# Patient Record
Sex: Male | Born: 1955 | Race: Black or African American | Hispanic: No
Health system: Southern US, Community
[De-identification: ages and names within clinical notes are randomized; demographics above are authoritative.]

---

## 2011-10-21 ENCOUNTER — Inpatient Hospital Stay: Payer: Self-pay | Admitting: Internal Medicine

## 2011-10-21 LAB — COMPREHENSIVE METABOLIC PANEL
Alkaline Phosphatase: 1769 U/L — ABNORMAL HIGH (ref 50–136)
Anion Gap: 24 — ABNORMAL HIGH (ref 7–16)
BUN: 65 mg/dL — ABNORMAL HIGH (ref 7–18)
Bilirubin,Total: 28.4 mg/dL — ABNORMAL HIGH (ref 0.2–1.0)
Calcium, Total: 8 mg/dL — ABNORMAL LOW (ref 8.5–10.1)
Chloride: 113 mmol/L — ABNORMAL HIGH (ref 98–107)
Co2: 8 mmol/L — CL (ref 21–32)
EGFR (African American): 31 — ABNORMAL LOW
EGFR (Non-African Amer.): 26 — ABNORMAL LOW
SGPT (ALT): 151 U/L — ABNORMAL HIGH

## 2011-10-21 LAB — CBC
HGB: 7.3 g/dL — ABNORMAL LOW (ref 13.0–18.0)
MCH: 31.8 pg (ref 26.0–34.0)
MCHC: 31.6 g/dL — ABNORMAL LOW (ref 32.0–36.0)
MCV: 101 fL — ABNORMAL HIGH (ref 80–100)
RBC: 2.29 10*6/uL — ABNORMAL LOW (ref 4.40–5.90)

## 2011-10-21 LAB — URINALYSIS, COMPLETE
Glucose,UR: NEGATIVE mg/dL (ref 0–75)
Leukocyte Esterase: NEGATIVE
Protein: 30
RBC,UR: 2 /HPF (ref 0–5)
Squamous Epithelial: <1

## 2011-10-21 LAB — DRUG SCREEN, URINE
Amphetamines, Ur Screen: NEGATIVE (ref ?–1000)
Benzodiazepine, Ur Scrn: NEGATIVE (ref ?–200)
Cocaine Metabolite,Ur ~~LOC~~: NEGATIVE (ref ?–300)
MDMA (Ecstasy)Ur Screen: NEGATIVE (ref ?–500)
Opiate, Ur Screen: NEGATIVE (ref ?–300)
Tricyclic, Ur Screen: NEGATIVE (ref ?–1000)

## 2011-10-21 LAB — ETHANOL: Ethanol %: <0.003 % (ref 0.000–0.080)

## 2011-10-21 LAB — CK TOTAL AND CKMB (NOT AT ARMC): CK, Total: 116 U/L (ref 35–232)

## 2011-10-22 LAB — CBC WITH DIFFERENTIAL/PLATELET
Bands: 2 %
HCT: 19.5 % — ABNORMAL LOW (ref 40.0–52.0)
HGB: 6.5 g/dL — ABNORMAL LOW (ref 13.0–18.0)
Lymphocytes: 10 %
MCHC: 33.4 g/dL (ref 32.0–36.0)
MCV: 98 fL (ref 80–100)
NRBC/100 WBC: 45 /
RBC: 2 10*6/uL — ABNORMAL LOW (ref 4.40–5.90)
RDW: 21.7 % — ABNORMAL HIGH (ref 11.5–14.5)
Segmented Neutrophils: 86 %
Variant Lymphocyte - H1-Rlymph: 1 %
WBC: 16.2 10*3/uL — ABNORMAL HIGH (ref 3.8–10.6)

## 2011-10-22 LAB — COMPREHENSIVE METABOLIC PANEL
Albumin: 1.9 g/dL — ABNORMAL LOW (ref 3.4–5.0)
Alkaline Phosphatase: 1630 U/L — ABNORMAL HIGH (ref 50–136)
Bilirubin,Total: 26.2 mg/dL — ABNORMAL HIGH (ref 0.2–1.0)
Chloride: 110 mmol/L — ABNORMAL HIGH (ref 98–107)
Co2: 18 mmol/L — ABNORMAL LOW (ref 21–32)
Creatinine: 2.49 mg/dL — ABNORMAL HIGH (ref 0.60–1.30)
EGFR (African American): 32 — ABNORMAL LOW
EGFR (Non-African Amer.): 28 — ABNORMAL LOW
Glucose: 275 mg/dL — ABNORMAL HIGH (ref 65–99)
SGOT(AST): 133 U/L — ABNORMAL HIGH (ref 15–37)
SGPT (ALT): 147 U/L — ABNORMAL HIGH

## 2011-10-22 LAB — CK TOTAL AND CKMB (NOT AT ARMC)
CK, Total: 158 U/L (ref 35–232)
CK-MB: 2.5 ng/mL (ref 0.5–3.6)

## 2011-10-22 LAB — HEMOGLOBIN
HGB: 6.7 g/dL — ABNORMAL LOW (ref 13.0–18.0)
HGB: 8.7 g/dL — ABNORMAL LOW (ref 13.0–18.0)

## 2011-10-22 LAB — BILIRUBIN, DIRECT: Bilirubin, Direct: 20.7 mg/dL — ABNORMAL HIGH (ref 0.00–0.20)

## 2011-10-23 LAB — COMPREHENSIVE METABOLIC PANEL
Albumin: 1.9 g/dL — ABNORMAL LOW (ref 3.4–5.0)
Alkaline Phosphatase: 1395 U/L — ABNORMAL HIGH (ref 50–136)
BUN: 55 mg/dL — ABNORMAL HIGH (ref 7–18)
Bilirubin,Total: 22.4 mg/dL — ABNORMAL HIGH (ref 0.2–1.0)
Chloride: 115 mmol/L — ABNORMAL HIGH (ref 98–107)
EGFR (Non-African Amer.): 39 — ABNORMAL LOW
Glucose: 129 mg/dL — ABNORMAL HIGH (ref 65–99)
Osmolality: 306 (ref 275–301)
SGOT(AST): 91 U/L — ABNORMAL HIGH (ref 15–37)
SGPT (ALT): 119 U/L — ABNORMAL HIGH
Sodium: 145 mmol/L (ref 136–145)

## 2011-10-23 LAB — CBC WITH DIFFERENTIAL/PLATELET
Basophil: 1 %
HGB: 8.7 g/dL — ABNORMAL LOW (ref 13.0–18.0)
MCH: 31.2 pg (ref 26.0–34.0)
MCHC: 32.9 g/dL (ref 32.0–36.0)
MCV: 95 fL (ref 80–100)
Platelet: 271 10*3/uL (ref 150–440)
RBC: 2.8 10*6/uL — ABNORMAL LOW (ref 4.40–5.90)
Segmented Neutrophils: 74 %

## 2011-10-24 LAB — CBC WITH DIFFERENTIAL/PLATELET
HGB: 7.8 g/dL — ABNORMAL LOW (ref 13.0–18.0)
Lymphocytes: 5 %
MCH: 30.6 pg (ref 26.0–34.0)
MCV: 94 fL (ref 80–100)
NRBC/100 WBC: 51 /
Platelet: 231 10*3/uL (ref 150–440)
RBC: 2.56 10*6/uL — ABNORMAL LOW (ref 4.40–5.90)
WBC: 14.3 10*3/uL — ABNORMAL HIGH (ref 3.8–10.6)

## 2011-10-24 LAB — COMPREHENSIVE METABOLIC PANEL
Albumin: 1.7 g/dL — ABNORMAL LOW (ref 3.4–5.0)
Anion Gap: 14 (ref 7–16)
BUN: 35 mg/dL — ABNORMAL HIGH (ref 7–18)
Calcium, Total: 7.4 mg/dL — ABNORMAL LOW (ref 8.5–10.1)
Co2: 13 mmol/L — ABNORMAL LOW (ref 21–32)
Creatinine: 1.24 mg/dL (ref 0.60–1.30)
EGFR (African American): >60
EGFR (Non-African Amer.): >60
Glucose: 159 mg/dL — ABNORMAL HIGH (ref 65–99)
Potassium: 3.2 mmol/L — ABNORMAL LOW (ref 3.5–5.1)
SGOT(AST): 54 U/L — ABNORMAL HIGH (ref 15–37)
SGPT (ALT): 94 U/L — ABNORMAL HIGH
Total Protein: 4.5 g/dL — ABNORMAL LOW (ref 6.4–8.2)

## 2011-10-25 LAB — CBC WITH DIFFERENTIAL/PLATELET
Bands: 3 %
Eosinophil: 3 %
HCT: 22.2 % — ABNORMAL LOW (ref 40.0–52.0)
HGB: 7.4 g/dL — ABNORMAL LOW (ref 13.0–18.0)
Lymphocytes: 13 %
MCV: 97 fL (ref 80–100)
Monocytes: 2 %
NRBC/100 WBC: 50 /
RBC: 2.29 10*6/uL — ABNORMAL LOW (ref 4.40–5.90)
Segmented Neutrophils: 79 %
WBC: 6.7 10*3/uL (ref 3.8–10.6)

## 2011-10-25 LAB — COMPREHENSIVE METABOLIC PANEL
Alkaline Phosphatase: 1000 U/L — ABNORMAL HIGH (ref 50–136)
Bilirubin,Total: 8.3 mg/dL — ABNORMAL HIGH (ref 0.2–1.0)
Calcium, Total: 7.8 mg/dL — ABNORMAL LOW (ref 8.5–10.1)
Chloride: 112 mmol/L — ABNORMAL HIGH (ref 98–107)
Co2: 17 mmol/L — ABNORMAL LOW (ref 21–32)
Creatinine: 1.01 mg/dL (ref 0.60–1.30)
EGFR (African American): >60
EGFR (Non-African Amer.): >60
Glucose: 120 mg/dL — ABNORMAL HIGH (ref 65–99)
SGOT(AST): 27 U/L (ref 15–37)
SGPT (ALT): 77 U/L
Total Protein: 4.5 g/dL — ABNORMAL LOW (ref 6.4–8.2)

## 2011-10-26 LAB — COMPREHENSIVE METABOLIC PANEL
Albumin: 1.7 g/dL — ABNORMAL LOW (ref 3.4–5.0)
BUN: 22 mg/dL — ABNORMAL HIGH (ref 7–18)
Bilirubin,Total: 7.8 mg/dL — ABNORMAL HIGH (ref 0.2–1.0)
Chloride: 112 mmol/L — ABNORMAL HIGH (ref 98–107)
Creatinine: 1.04 mg/dL (ref 0.60–1.30)
Glucose: 84 mg/dL (ref 65–99)
SGOT(AST): 34 U/L (ref 15–37)
SGPT (ALT): 74 U/L
Sodium: 140 mmol/L (ref 136–145)
Total Protein: 4.8 g/dL — ABNORMAL LOW (ref 6.4–8.2)

## 2011-10-27 LAB — CULTURE, BLOOD (SINGLE)

## 2011-10-28 ENCOUNTER — Ambulatory Visit: Payer: Self-pay | Admitting: Internal Medicine

## 2011-10-28 LAB — COMPREHENSIVE METABOLIC PANEL
Albumin: 1.8 g/dL — ABNORMAL LOW (ref 3.4–5.0)
BUN: 10 mg/dL (ref 7–18)
Creatinine: 0.82 mg/dL (ref 0.60–1.30)
Glucose: 109 mg/dL — ABNORMAL HIGH (ref 65–99)
Potassium: 4 mmol/L (ref 3.5–5.1)
SGOT(AST): 23 U/L (ref 15–37)
SGPT (ALT): 61 U/L
Total Protein: 5.1 g/dL — ABNORMAL LOW (ref 6.4–8.2)

## 2011-10-29 LAB — CBC WITH DIFFERENTIAL/PLATELET
Basophil #: 0 10*3/uL (ref 0.0–0.1)
Basophil %: 0.6 %
Eosinophil #: 0.1 10*3/uL (ref 0.0–0.7)
HGB: 6.2 g/dL — ABNORMAL LOW (ref 13.0–18.0)
Lymphocyte %: 24.4 %
MCHC: 34.9 g/dL (ref 32.0–36.0)
Monocyte #: 0.7 x10 3/mm (ref 0.2–1.0)
Neutrophil #: 4.4 10*3/uL (ref 1.4–6.5)
Neutrophil %: 63.7 %
Platelet: 288 10*3/uL (ref 150–440)
RBC: 1.76 10*6/uL — ABNORMAL LOW (ref 4.40–5.90)

## 2011-10-29 LAB — IRON AND TIBC
Iron Saturation: 23 %
Iron: 33 ug/dL — ABNORMAL LOW (ref 65–175)

## 2011-10-29 LAB — RETICULOCYTES
Absolute Retic Count: 0.2884 10*6/uL — ABNORMAL HIGH (ref 0.031–0.129)
Reticulocyte: 16.38 % — ABNORMAL HIGH (ref 0.7–2.5)

## 2011-10-29 LAB — PHOSPHORUS: Phosphorus: 2.3 mg/dL — ABNORMAL LOW

## 2011-10-29 LAB — FERRITIN: Ferritin (ARMC): 2464 ng/mL — ABNORMAL HIGH (ref 8–388)

## 2011-10-29 LAB — CREATININE, SERUM
Creatinine: 0.71 mg/dL
EGFR (African American): >60
EGFR (Non-African Amer.): >60

## 2011-10-29 LAB — MAGNESIUM: Magnesium: 1.2 mg/dL — ABNORMAL LOW

## 2011-10-30 LAB — CBC WITH DIFFERENTIAL/PLATELET
Basophil #: 0.1 10*3/uL (ref 0.0–0.1)
Basophil %: 0.7 %
Eosinophil %: 0.7 %
HCT: 23.6 % — ABNORMAL LOW (ref 40.0–52.0)
HGB: 8.2 g/dL — ABNORMAL LOW (ref 13.0–18.0)
Lymphocyte #: 1.7 10*3/uL (ref 1.0–3.6)
Lymphocyte %: 23.4 %
MCH: 34.6 pg — ABNORMAL HIGH (ref 26.0–34.0)
Monocyte %: 9.2 %
Neutrophil #: 4.7 10*3/uL (ref 1.4–6.5)
Platelet: 304 10*3/uL (ref 150–440)
RBC: 2.36 10*6/uL — ABNORMAL LOW (ref 4.40–5.90)
RDW: 19.9 % — ABNORMAL HIGH (ref 11.5–14.5)
WBC: 7.1 10*3/uL (ref 3.8–10.6)

## 2011-10-30 LAB — COMPREHENSIVE METABOLIC PANEL
Albumin: 1.7 g/dL — ABNORMAL LOW (ref 3.4–5.0)
Alkaline Phosphatase: 584 U/L — ABNORMAL HIGH (ref 50–136)
Anion Gap: 10 (ref 7–16)
BUN: 11 mg/dL (ref 7–18)
Bilirubin,Total: 5.5 mg/dL — ABNORMAL HIGH (ref 0.2–1.0)
Chloride: 108 mmol/L — ABNORMAL HIGH (ref 98–107)
EGFR (African American): >60
EGFR (Non-African Amer.): >60
Potassium: 4.1 mmol/L (ref 3.5–5.1)
SGPT (ALT): 49 U/L
Sodium: 140 mmol/L (ref 136–145)
Total Protein: 4.9 g/dL — ABNORMAL LOW (ref 6.4–8.2)

## 2011-10-31 LAB — COMPREHENSIVE METABOLIC PANEL
Anion Gap: 9 (ref 7–16)
Bilirubin,Total: 5.1 mg/dL — ABNORMAL HIGH (ref 0.2–1.0)
Calcium, Total: 7.4 mg/dL — ABNORMAL LOW (ref 8.5–10.1)
Chloride: 107 mmol/L (ref 98–107)
Co2: 23 mmol/L (ref 21–32)
Creatinine: 0.87 mg/dL (ref 0.60–1.30)
EGFR (African American): >60
EGFR (Non-African Amer.): >60
Osmolality: 275 (ref 275–301)
SGPT (ALT): 46 U/L
Sodium: 139 mmol/L (ref 136–145)

## 2011-11-01 LAB — COMPREHENSIVE METABOLIC PANEL
Alkaline Phosphatase: 561 U/L — ABNORMAL HIGH (ref 50–136)
BUN: 8 mg/dL (ref 7–18)
Bilirubin,Total: 5.5 mg/dL — ABNORMAL HIGH (ref 0.2–1.0)
Chloride: 108 mmol/L — ABNORMAL HIGH (ref 98–107)
Creatinine: 0.84 mg/dL (ref 0.60–1.30)
EGFR (African American): >60
EGFR (Non-African Amer.): >60
Glucose: 89 mg/dL (ref 65–99)
SGOT(AST): 23 U/L (ref 15–37)
SGPT (ALT): 45 U/L
Total Protein: 5 g/dL — ABNORMAL LOW (ref 6.4–8.2)

## 2011-11-01 LAB — URINE IEP, RANDOM

## 2011-11-03 ENCOUNTER — Ambulatory Visit: Payer: Self-pay | Admitting: Internal Medicine

## 2011-11-03 ENCOUNTER — Telehealth: Payer: Self-pay

## 2011-11-03 ENCOUNTER — Other Ambulatory Visit: Payer: Self-pay

## 2011-11-03 DIAGNOSIS — K8689 Other specified diseases of pancreas: Secondary | ICD-10-CM

## 2011-11-03 LAB — HEPATIC FUNCTION PANEL A (ARMC)
Albumin: 2.2 g/dL — ABNORMAL LOW (ref 3.4–5.0)
Alkaline Phosphatase: 595 U/L — ABNORMAL HIGH (ref 50–136)
Bilirubin, Direct: 5.4 mg/dL — ABNORMAL HIGH (ref 0.00–0.20)
Bilirubin,Total: 6.1 mg/dL — ABNORMAL HIGH (ref 0.2–1.0)
SGOT(AST): 28 U/L (ref 15–37)
SGPT (ALT): 52 U/L
Total Protein: 6.1 g/dL — ABNORMAL LOW (ref 6.4–8.2)

## 2011-11-03 LAB — CBC CANCER CENTER
Basophil %: 0.6 %
Eosinophil #: 0 x10 3/mm (ref 0.0–0.7)
Eosinophil %: 0.2 %
Lymphocyte #: 2.2 x10 3/mm (ref 1.0–3.6)
Lymphocyte %: 25.4 %
MCH: 34.4 pg — ABNORMAL HIGH (ref 26.0–34.0)
MCHC: 33.9 g/dL (ref 32.0–36.0)
MCV: 102 fL — ABNORMAL HIGH (ref 80–100)
Monocyte #: 0.6 x10 3/mm (ref 0.2–1.0)
Monocyte %: 7.2 %
Neutrophil %: 66.6 %
Platelet: 465 x10 3/mm — ABNORMAL HIGH (ref 150–440)
RBC: 2.88 10*6/uL — ABNORMAL LOW (ref 4.40–5.90)
RDW: 18.2 % — ABNORMAL HIGH (ref 11.5–14.5)
WBC: 8.6 x10 3/mm (ref 3.8–10.6)

## 2011-11-03 NOTE — Telephone Encounter (Signed)
Pt has been scheduled for EUS on 11/18/11 11 am NPO after midnight pt needs to be instructed and meds reviewed.

## 2011-11-03 NOTE — Telephone Encounter (Signed)
Unable to reach pt phone is asking for remote access code.  I have contacted Dr Lars Pinks office for another number or pt has an appt today with Dr Lorre Nick and I can fax instructions to them to give to the pt   Left message on machine to call back

## 2011-11-03 NOTE — Telephone Encounter (Signed)
Dr Paula Compton office will try and contact the pt and get back in touch with me

## 2011-11-04 ENCOUNTER — Ambulatory Visit: Payer: Self-pay | Admitting: Internal Medicine

## 2011-11-04 NOTE — Telephone Encounter (Signed)
Unable to reach pt no voice mail.

## 2011-11-04 NOTE — Telephone Encounter (Signed)
I spoke with Doni at Dr Paula Compton office and she is going to call the pt again and try to reach him.  I did mail the info to the home for the procedure and Dr Lorre Nick is aware

## 2011-11-10 LAB — CBC CANCER CENTER
Eosinophil %: 1.1 %
Lymphocyte #: 2 x10 3/mm (ref 1.0–3.6)
MCH: 33.7 pg (ref 26.0–34.0)
MCHC: 33.6 g/dL (ref 32.0–36.0)
MCV: 100 fL (ref 80–100)
Monocyte #: 0.4 x10 3/mm (ref 0.2–1.0)
Monocyte %: 6 %
Neutrophil %: 63.2 %
Platelet: 421 x10 3/mm (ref 150–440)
RBC: 3.12 10*6/uL — ABNORMAL LOW (ref 4.40–5.90)

## 2011-11-10 LAB — OCCULT BLOOD X 1 CARD TO LAB, STOOL
Occult Blood, Feces: NEGATIVE
Occult Blood, Feces: NEGATIVE

## 2011-11-11 LAB — CREATININE, SERUM
Creatinine: 1.05 mg/dL (ref 0.60–1.30)
EGFR (African American): >60
EGFR (Non-African Amer.): >60

## 2011-11-11 LAB — HEPATIC FUNCTION PANEL A (ARMC)
Albumin: 2.4 g/dL — ABNORMAL LOW (ref 3.4–5.0)
Bilirubin, Direct: 5 mg/dL — ABNORMAL HIGH (ref 0.00–0.20)
Bilirubin,Total: 5.3 mg/dL — ABNORMAL HIGH (ref 0.2–1.0)
SGPT (ALT): 33 U/L (ref 12–78)
Total Protein: 6.6 g/dL (ref 6.4–8.2)

## 2011-11-11 LAB — PROTIME-INR
INR: 1.2
Prothrombin Time: 15.2 secs — ABNORMAL HIGH (ref 11.5–14.7)

## 2011-11-12 LAB — CEA: CEA: 8.9 ng/mL — ABNORMAL HIGH (ref 0.0–4.7)

## 2011-11-16 ENCOUNTER — Telehealth: Payer: Self-pay | Admitting: Gastroenterology

## 2011-11-16 ENCOUNTER — Inpatient Hospital Stay: Payer: Self-pay | Admitting: Internal Medicine

## 2011-11-16 LAB — COMPREHENSIVE METABOLIC PANEL
Alkaline Phosphatase: 296 U/L — ABNORMAL HIGH (ref 50–136)
Bilirubin,Total: 4.7 mg/dL — ABNORMAL HIGH (ref 0.2–1.0)
Chloride: 100 mmol/L (ref 98–107)
Co2: 33 mmol/L — ABNORMAL HIGH (ref 21–32)
Creatinine: 1.25 mg/dL (ref 0.60–1.30)
EGFR (African American): >60
EGFR (Non-African Amer.): >60
SGOT(AST): 23 U/L (ref 15–37)
SGPT (ALT): 23 U/L (ref 12–78)

## 2011-11-16 LAB — LIPASE, BLOOD: Lipase: 23 U/L — ABNORMAL LOW (ref 73–393)

## 2011-11-16 LAB — CBC
HGB: 11 g/dL — ABNORMAL LOW (ref 13.0–18.0)
MCH: 31.5 pg (ref 26.0–34.0)
MCV: 98 fL (ref 80–100)
RBC: 3.51 10*6/uL — ABNORMAL LOW (ref 4.40–5.90)
WBC: 14.3 10*3/uL — ABNORMAL HIGH (ref 3.8–10.6)

## 2011-11-16 NOTE — Telephone Encounter (Signed)
I called and the appt has not been cx at [facility] endo call placed to [facility] to ask if he has heard anything. Left message

## 2011-11-16 NOTE — Telephone Encounter (Signed)
Pt has not notified this office that the procedure has been moved.

## 2011-11-17 ENCOUNTER — Telehealth: Payer: Self-pay

## 2011-11-17 LAB — CBC WITH DIFFERENTIAL/PLATELET
Basophil #: 0 10*3/uL (ref 0.0–0.1)
HCT: 33.5 % — ABNORMAL LOW (ref 40.0–52.0)
HGB: 11 g/dL — ABNORMAL LOW (ref 13.0–18.0)
Lymphocyte %: 11 %
Monocyte %: 6.4 %
Neutrophil #: 11.8 10*3/uL — ABNORMAL HIGH (ref 1.4–6.5)
Neutrophil %: 82.4 %
RDW: 15.5 % — ABNORMAL HIGH (ref 11.5–14.5)
WBC: 14.3 10*3/uL — ABNORMAL HIGH (ref 3.8–10.6)

## 2011-11-17 LAB — BASIC METABOLIC PANEL
Anion Gap: 10 (ref 7–16)
Calcium, Total: 8.1 mg/dL — ABNORMAL LOW (ref 8.5–10.1)
Co2: 31 mmol/L (ref 21–32)
Creatinine: 1.08 mg/dL (ref 0.60–1.30)
EGFR (African American): >60
EGFR (Non-African Amer.): >60
Glucose: 130 mg/dL — ABNORMAL HIGH (ref 65–99)
Potassium: 2.9 mmol/L — ABNORMAL LOW (ref 3.5–5.1)
Sodium: 145 mmol/L (ref 136–145)

## 2011-11-17 LAB — URINALYSIS, COMPLETE
Bacteria: NONE SEEN
Bilirubin,UR: NEGATIVE
Glucose,UR: NEGATIVE mg/dL (ref 0–75)
Ph: 5 (ref 4.5–8.0)
RBC,UR: 10 /HPF (ref 0–5)
Squamous Epithelial: 2

## 2011-11-17 NOTE — Telephone Encounter (Signed)
Pt cx at [facility] per Noreene Larsson

## 2011-11-17 NOTE — Telephone Encounter (Signed)
I spoke with Corey Ortiz from [facility]. The patient has a gastric outlet obstruction, very low potassium level. He left from [facility] today AGAINST MEDICAL ADVICE, he pulled his NG tube out and walked out of the hospital. He did not get enough potassium from what they can tell.  I called Corey Ortiz on the phone, he was at home. I explained to him that given his emergency situation with outlet obstruction, dangerously low electrolyte levels I recommended very strongly that he go back to the emergency room, back to [facility] cancer Center. Had her explained to me that they are very happy to help take care of him as I am as well.  For now, we are going to cancel his endoscopic ultrasound until I hear back from [name].  I do not think however that elective endoscopic ultrasound to stage his pancreatic or biliary cancer is a good idea in the setting of acute gastric obstruction, acute electrolyte abnormality.

## 2011-11-17 NOTE — Telephone Encounter (Signed)
Heather called from Dr Lars Pinks office and wanted to notify Dr Christella Hartigan that the pt was admitted to [facility] yesterday with gastric obstruction.  He left against medical advice with a 2.9 potassium.  Dr Ardis Rowan is covering for Dr Neale Burly.  [phone] call back number

## 2011-11-18 ENCOUNTER — Inpatient Hospital Stay: Payer: Self-pay | Admitting: Internal Medicine

## 2011-11-18 ENCOUNTER — Encounter (HOSPITAL_COMMUNITY): Admission: RE | Payer: Self-pay | Source: Ambulatory Visit

## 2011-11-18 ENCOUNTER — Ambulatory Visit (HOSPITAL_COMMUNITY)
Admission: RE | Admit: 2011-11-18 | Payer: TRICARE For Life (TFL) | Source: Ambulatory Visit | Admitting: Gastroenterology

## 2011-11-18 LAB — COMPREHENSIVE METABOLIC PANEL
Alkaline Phosphatase: 282 U/L — ABNORMAL HIGH (ref 50–136)
Anion Gap: 11 (ref 7–16)
BUN: 31 mg/dL — ABNORMAL HIGH (ref 7–18)
Bilirubin,Total: 4.4 mg/dL — ABNORMAL HIGH (ref 0.2–1.0)
Calcium, Total: 8.9 mg/dL (ref 8.5–10.1)
Chloride: 101 mmol/L (ref 98–107)
Creatinine: 1.01 mg/dL (ref 0.60–1.30)
EGFR (African American): >60
EGFR (Non-African Amer.): >60
Glucose: 122 mg/dL — ABNORMAL HIGH (ref 65–99)
Osmolality: 298 (ref 275–301)
Potassium: 2.7 mmol/L — ABNORMAL LOW (ref 3.5–5.1)
Sodium: 146 mmol/L — ABNORMAL HIGH (ref 136–145)
Total Protein: 6.6 g/dL (ref 6.4–8.2)

## 2011-11-18 LAB — URINALYSIS, COMPLETE
Bacteria: NONE SEEN
Bilirubin,UR: NEGATIVE
Blood: NEGATIVE
Nitrite: NEGATIVE
Ph: 5 (ref 4.5–8.0)
RBC,UR: 1 /HPF (ref 0–5)
Specific Gravity: 1.034 (ref 1.003–1.030)
Squamous Epithelial: <1
WBC UR: 7 /HPF (ref 0–5)

## 2011-11-18 LAB — CBC
HGB: 14.4 g/dL (ref 13.0–18.0)
MCH: 31.1 pg (ref 26.0–34.0)
MCHC: 32.1 g/dL (ref 32.0–36.0)
MCV: 97 fL (ref 80–100)
RBC: 4.62 10*6/uL (ref 4.40–5.90)
RDW: 15.4 % — ABNORMAL HIGH (ref 11.5–14.5)

## 2011-11-18 SURGERY — UPPER ENDOSCOPIC ULTRASOUND (EUS) LINEAR
Anesthesia: Monitor Anesthesia Care

## 2011-11-24 ENCOUNTER — Inpatient Hospital Stay: Payer: Self-pay | Admitting: Surgery

## 2011-11-24 LAB — CBC
MCHC: 32.7 g/dL (ref 32.0–36.0)
MCV: 97 fL (ref 80–100)
RDW: 15.6 % — ABNORMAL HIGH (ref 11.5–14.5)

## 2011-11-24 LAB — PROTIME-INR: Prothrombin Time: 14.9 secs — ABNORMAL HIGH (ref 11.5–14.7)

## 2011-11-24 LAB — TSH: Thyroid Stimulating Horm: 0.68 u[IU]/mL

## 2011-11-24 LAB — LIPASE, BLOOD: Lipase: 28 U/L — ABNORMAL LOW (ref 73–393)

## 2011-11-24 LAB — COMPREHENSIVE METABOLIC PANEL
Albumin: 2.2 g/dL — ABNORMAL LOW (ref 3.4–5.0)
Anion Gap: 7 (ref 7–16)
BUN: 30 mg/dL — ABNORMAL HIGH (ref 7–18)
Bilirubin,Total: 3.5 mg/dL — ABNORMAL HIGH (ref 0.2–1.0)
Co2: 35 mmol/L — ABNORMAL HIGH (ref 21–32)
Creatinine: 1.29 mg/dL (ref 0.60–1.30)
SGOT(AST): 37 U/L (ref 15–37)
SGPT (ALT): 33 U/L (ref 12–78)
Sodium: 145 mmol/L (ref 136–145)
Total Protein: 5.8 g/dL — ABNORMAL LOW (ref 6.4–8.2)

## 2011-11-24 LAB — ACETAMINOPHEN LEVEL: Acetaminophen: <2 ug/mL

## 2011-11-24 LAB — ETHANOL
Ethanol %: <0.003 % (ref 0.000–0.080)
Ethanol: <3 mg/dL

## 2011-11-24 LAB — AMMONIA: Ammonia, Plasma: <25 mcmol/L (ref 11–32)

## 2011-11-24 LAB — TROPONIN I: Troponin-I: 0.37 ng/mL — ABNORMAL HIGH

## 2011-11-25 LAB — URINALYSIS, COMPLETE
Blood: NEGATIVE
Glucose,UR: 50 mg/dL (ref 0–75)
Hyaline Cast: 6
Nitrite: NEGATIVE
Ph: 5 (ref 4.5–8.0)
Protein: 30
Specific Gravity: 1.026 (ref 1.003–1.030)
WBC UR: 45 /HPF (ref 0–5)

## 2011-11-25 LAB — BASIC METABOLIC PANEL
BUN: 32 mg/dL — ABNORMAL HIGH (ref 7–18)
Calcium, Total: 8.1 mg/dL — ABNORMAL LOW (ref 8.5–10.1)
EGFR (African American): >60
EGFR (Non-African Amer.): >60
Glucose: 176 mg/dL — ABNORMAL HIGH (ref 65–99)
Osmolality: 309 (ref 275–301)

## 2011-11-25 LAB — DRUG SCREEN, URINE
Barbiturates, Ur Screen: NEGATIVE (ref ?–200)
Benzodiazepine, Ur Scrn: NEGATIVE (ref ?–200)
Cannabinoid 50 Ng, Ur ~~LOC~~: NEGATIVE (ref ?–50)
Cocaine Metabolite,Ur ~~LOC~~: NEGATIVE (ref ?–300)
Methadone, Ur Screen: NEGATIVE (ref ?–300)
Tricyclic, Ur Screen: NEGATIVE (ref ?–1000)

## 2011-11-25 LAB — CBC WITH DIFFERENTIAL/PLATELET
Basophil %: 0.2 %
Eosinophil %: 0 %
HCT: 33 % — ABNORMAL LOW (ref 40.0–52.0)
HGB: 10.9 g/dL — ABNORMAL LOW (ref 13.0–18.0)
Lymphocyte %: 9.9 %
Monocyte #: 1 x10 3/mm (ref 0.2–1.0)
Monocyte %: 9 %
Neutrophil %: 80.9 %
Platelet: 232 10*3/uL (ref 150–440)
RBC: 3.41 10*6/uL — ABNORMAL LOW (ref 4.40–5.90)
WBC: 10.9 10*3/uL — ABNORMAL HIGH (ref 3.8–10.6)

## 2011-11-26 LAB — PROTIME-INR: Prothrombin Time: 14.8 secs — ABNORMAL HIGH (ref 11.5–14.7)

## 2011-11-26 LAB — BASIC METABOLIC PANEL
Anion Gap: 4 — ABNORMAL LOW (ref 7–16)
BUN: 23 mg/dL — ABNORMAL HIGH (ref 7–18)
Calcium, Total: 7.9 mg/dL — ABNORMAL LOW (ref 8.5–10.1)
Chloride: 111 mmol/L — ABNORMAL HIGH (ref 98–107)
Co2: 32 mmol/L (ref 21–32)
Creatinine: 0.94 mg/dL (ref 0.60–1.30)
Glucose: 163 mg/dL — ABNORMAL HIGH (ref 65–99)
Potassium: 3.1 mmol/L — ABNORMAL LOW (ref 3.5–5.1)
Sodium: 147 mmol/L — ABNORMAL HIGH (ref 136–145)

## 2011-11-26 LAB — CBC WITH DIFFERENTIAL/PLATELET
Basophil #: 0.1 10*3/uL (ref 0.0–0.1)
Lymphocyte #: 1 10*3/uL (ref 1.0–3.6)
Lymphocyte %: 8.6 %
MCHC: 32.7 g/dL (ref 32.0–36.0)
Monocyte %: 5 %
Neutrophil %: 85.8 %
Platelet: 176 10*3/uL (ref 150–440)
RDW: 15.9 % — ABNORMAL HIGH (ref 11.5–14.5)

## 2011-11-26 LAB — APTT: Activated PTT: 33.8 secs (ref 23.6–35.9)

## 2011-11-27 LAB — CBC WITH DIFFERENTIAL/PLATELET
Basophil %: 0.4 %
Eosinophil #: 0.1 10*3/uL (ref 0.0–0.7)
Eosinophil %: 0.9 %
HCT: 26.6 % — ABNORMAL LOW (ref 40.0–52.0)
HGB: 8.7 g/dL — ABNORMAL LOW (ref 13.0–18.0)
Lymphocyte #: 1.6 10*3/uL (ref 1.0–3.6)
MCH: 31.9 pg (ref 26.0–34.0)
MCV: 97 fL (ref 80–100)
Monocyte #: 0.4 x10 3/mm (ref 0.2–1.0)
Monocyte %: 5.2 %
Neutrophil #: 6.5 10*3/uL (ref 1.4–6.5)
Neutrophil %: 75.4 %
RBC: 2.73 10*6/uL — ABNORMAL LOW (ref 4.40–5.90)
WBC: 8.6 10*3/uL (ref 3.8–10.6)

## 2011-11-27 LAB — BASIC METABOLIC PANEL
Anion Gap: 8 (ref 7–16)
Chloride: 114 mmol/L — ABNORMAL HIGH (ref 98–107)
Co2: 25 mmol/L (ref 21–32)
Creatinine: 0.68 mg/dL (ref 0.60–1.30)
EGFR (African American): >60
Sodium: 147 mmol/L — ABNORMAL HIGH (ref 136–145)

## 2011-11-27 LAB — MAGNESIUM: Magnesium: 1.5 mg/dL — ABNORMAL LOW

## 2011-11-28 LAB — HEPATIC FUNCTION PANEL A (ARMC)
Alkaline Phosphatase: 116 U/L (ref 50–136)
Bilirubin,Total: 2.2 mg/dL — ABNORMAL HIGH (ref 0.2–1.0)
SGOT(AST): 23 U/L (ref 15–37)
SGPT (ALT): 23 U/L (ref 12–78)
Total Protein: 4.6 g/dL — ABNORMAL LOW (ref 6.4–8.2)

## 2011-11-30 LAB — CREATININE, SERUM
EGFR (African American): >60
EGFR (Non-African Amer.): >60

## 2011-11-30 LAB — CBC WITH DIFFERENTIAL/PLATELET
Basophil %: 0.1 %
Eosinophil #: 0 10*3/uL (ref 0.0–0.7)
Eosinophil %: 0 %
HCT: 25.5 % — ABNORMAL LOW (ref 40.0–52.0)
HGB: 8.3 g/dL — ABNORMAL LOW (ref 13.0–18.0)
Lymphocyte #: 1.7 10*3/uL (ref 1.0–3.6)
Lymphocyte %: 11.1 %
Lymphocytes: 8 %
MCH: 30.7 pg (ref 26.0–34.0)
MCHC: 32.4 g/dL (ref 32.0–36.0)
Monocytes: 2 %
Neutrophil #: 12.6 10*3/uL — ABNORMAL HIGH (ref 1.4–6.5)
Neutrophil %: 84.1 %
RDW: 16.5 % — ABNORMAL HIGH (ref 11.5–14.5)
WBC: 15 10*3/uL — ABNORMAL HIGH (ref 3.8–10.6)

## 2011-11-30 LAB — COMPREHENSIVE METABOLIC PANEL
Albumin: 1.4 g/dL — ABNORMAL LOW (ref 3.4–5.0)
Calcium, Total: 7.2 mg/dL — ABNORMAL LOW (ref 8.5–10.1)
Chloride: 103 mmol/L (ref 98–107)
Co2: 22 mmol/L (ref 21–32)
Creatinine: 1.03 mg/dL (ref 0.60–1.30)
EGFR (African American): >60
EGFR (Non-African Amer.): >60
Osmolality: 277 (ref 275–301)
Potassium: 3.4 mmol/L — ABNORMAL LOW (ref 3.5–5.1)
Sodium: 136 mmol/L (ref 136–145)
Total Protein: 4.1 g/dL — ABNORMAL LOW (ref 6.4–8.2)

## 2011-11-30 LAB — PROTIME-INR
INR: 1.5
Prothrombin Time: 18.5 secs — ABNORMAL HIGH (ref 11.5–14.7)

## 2011-11-30 LAB — HEMOGLOBIN: HGB: 6.2 g/dL — ABNORMAL LOW (ref 13.0–18.0)

## 2011-12-01 LAB — CBC WITH DIFFERENTIAL/PLATELET
Basophil #: 0 10*3/uL (ref 0.0–0.1)
Eosinophil #: 0 10*3/uL (ref 0.0–0.7)
HCT: 23.5 % — ABNORMAL LOW (ref 40.0–52.0)
Lymphocyte %: 16 %
MCH: 31.8 pg (ref 26.0–34.0)
MCHC: 33.7 g/dL (ref 32.0–36.0)
Monocyte #: 0.8 x10 3/mm (ref 0.2–1.0)
Monocyte %: 8.4 %
Neutrophil #: 7.4 10*3/uL — ABNORMAL HIGH (ref 1.4–6.5)
Neutrophil %: 75.2 %
Platelet: 171 10*3/uL (ref 150–440)
RDW: 15.8 % — ABNORMAL HIGH (ref 11.5–14.5)

## 2011-12-01 LAB — COMPREHENSIVE METABOLIC PANEL
Albumin: 1.3 g/dL — ABNORMAL LOW (ref 3.4–5.0)
Anion Gap: 9 (ref 7–16)
BUN: 11 mg/dL (ref 7–18)
Chloride: 112 mmol/L — ABNORMAL HIGH (ref 98–107)
EGFR (African American): >60
Osmolality: 298 (ref 275–301)
Potassium: 4.9 mmol/L (ref 3.5–5.1)
SGOT(AST): 19 U/L (ref 15–37)
Total Protein: 3.5 g/dL — ABNORMAL LOW (ref 6.4–8.2)

## 2011-12-01 LAB — HEMOGLOBIN: HGB: 9.5 g/dL — ABNORMAL LOW (ref 13.0–18.0)

## 2011-12-02 LAB — CBC WITH DIFFERENTIAL/PLATELET
Basophil #: 0 10*3/uL (ref 0.0–0.1)
Basophil %: 0.2 %
Eosinophil #: 0.1 10*3/uL (ref 0.0–0.7)
Eosinophil %: 0.7 %
HCT: 26.8 % — ABNORMAL LOW (ref 40.0–52.0)
HGB: 9 g/dL — ABNORMAL LOW (ref 13.0–18.0)
Lymphocyte %: 17.4 %
MCH: 29.7 pg (ref 26.0–34.0)
MCV: 89 fL (ref 80–100)
Neutrophil %: 74.6 %
RBC: 3.02 10*6/uL — ABNORMAL LOW (ref 4.40–5.90)
RDW: 16.1 % — ABNORMAL HIGH (ref 11.5–14.5)

## 2011-12-02 LAB — BASIC METABOLIC PANEL
BUN: 10 mg/dL (ref 7–18)
Chloride: 110 mmol/L — ABNORMAL HIGH (ref 98–107)
Co2: 24 mmol/L (ref 21–32)
Creatinine: 0.78 mg/dL (ref 0.60–1.30)
Glucose: 93 mg/dL (ref 65–99)
Potassium: 3.4 mmol/L — ABNORMAL LOW (ref 3.5–5.1)
Sodium: 142 mmol/L (ref 136–145)

## 2011-12-02 LAB — PATHOLOGY REPORT

## 2011-12-03 LAB — AMMONIA: Ammonia, Plasma: 25 mcmol/L (ref 11–32)

## 2011-12-04 LAB — CBC WITH DIFFERENTIAL/PLATELET
Basophil #: 0 10*3/uL (ref 0.0–0.1)
Basophil %: 0.2 %
Eosinophil #: 0 10*3/uL (ref 0.0–0.7)
Eosinophil #: 0 10*3/uL (ref 0.0–0.7)
Eosinophil %: 0 %
Eosinophil %: 0 %
HCT: 24.7 % — ABNORMAL LOW (ref 40.0–52.0)
HGB: 5.1 g/dL — ABNORMAL LOW (ref 13.0–18.0)
Lymphocyte #: 0.9 10*3/uL — ABNORMAL LOW (ref 1.0–3.6)
Lymphocyte #: 1.1 10*3/uL (ref 1.0–3.6)
MCH: 30.6 pg (ref 26.0–34.0)
MCH: 30 pg (ref 26.0–34.0)
MCHC: 34.4 g/dL (ref 32.0–36.0)
MCHC: 35.1 g/dL (ref 32.0–36.0)
MCV: 89 fL (ref 80–100)
Monocyte #: 0.4 x10 3/mm (ref 0.2–1.0)
Monocyte #: 0.7 x10 3/mm (ref 0.2–1.0)
Monocyte %: 6.8 %
Neutrophil %: 83.4 %
Neutrophil %: 82.1 %
Platelet: 179 10*3/uL (ref 150–440)
Platelet: 122 10*3/uL — ABNORMAL LOW (ref 150–440)
RBC: 1.68 10*6/uL — ABNORMAL LOW (ref 4.40–5.90)
RDW: 15.7 % — ABNORMAL HIGH (ref 11.5–14.5)
RDW: 16.2 % — ABNORMAL HIGH (ref 11.5–14.5)
WBC: 8.3 10*3/uL (ref 3.8–10.6)

## 2011-12-04 LAB — COMPREHENSIVE METABOLIC PANEL
BUN: 15 mg/dL (ref 7–18)
Bilirubin,Total: 1.5 mg/dL — ABNORMAL HIGH (ref 0.2–1.0)
Calcium, Total: 7.2 mg/dL — ABNORMAL LOW (ref 8.5–10.1)
Chloride: 112 mmol/L — ABNORMAL HIGH (ref 98–107)
Co2: 24 mmol/L (ref 21–32)
EGFR (African American): >60
EGFR (Non-African Amer.): >60
Glucose: 241 mg/dL — ABNORMAL HIGH (ref 65–99)
SGOT(AST): 18 U/L (ref 15–37)
SGPT (ALT): 27 U/L (ref 12–78)
Total Protein: 3.3 g/dL — ABNORMAL LOW (ref 6.4–8.2)

## 2011-12-04 LAB — HEPATIC FUNCTION PANEL A (ARMC): Bilirubin, Direct: 1.3 mg/dL — ABNORMAL HIGH (ref 0.00–0.20)

## 2011-12-04 LAB — FIBRINOGEN: Fibrinogen: 213 mg/dL (ref 210–470)

## 2011-12-04 LAB — PROTIME-INR: Prothrombin Time: 19.6 secs — ABNORMAL HIGH (ref 11.5–14.7)

## 2011-12-05 ENCOUNTER — Ambulatory Visit: Payer: Self-pay | Admitting: Internal Medicine

## 2011-12-05 LAB — CBC WITH DIFFERENTIAL/PLATELET
Eosinophil %: 0 %
HCT: 25.8 % — ABNORMAL LOW (ref 40.0–52.0)
Lymphocyte #: 1.1 10*3/uL (ref 1.0–3.6)
Lymphocyte %: 9.6 %
MCV: 85 fL (ref 80–100)
Monocyte %: 7.1 %
Platelet: 131 10*3/uL — ABNORMAL LOW (ref 150–440)
RBC: 3.04 10*6/uL — ABNORMAL LOW (ref 4.40–5.90)
WBC: 11.7 10*3/uL — ABNORMAL HIGH (ref 3.8–10.6)

## 2011-12-05 LAB — COMPREHENSIVE METABOLIC PANEL
Albumin: 1.8 g/dL — ABNORMAL LOW (ref 3.4–5.0)
Alkaline Phosphatase: 74 U/L (ref 50–136)
Calcium, Total: 7.5 mg/dL — ABNORMAL LOW (ref 8.5–10.1)
Co2: 24 mmol/L (ref 21–32)
EGFR (African American): >60
EGFR (Non-African Amer.): >60
Glucose: 223 mg/dL — ABNORMAL HIGH (ref 65–99)
Osmolality: 298 (ref 275–301)
Potassium: 3.5 mmol/L (ref 3.5–5.1)
SGOT(AST): 22 U/L (ref 15–37)
Sodium: 145 mmol/L (ref 136–145)

## 2011-12-05 LAB — APTT: Activated PTT: 31.1 secs (ref 23.6–35.9)

## 2011-12-05 LAB — PROTIME-INR: Prothrombin Time: 17.7 secs — ABNORMAL HIGH (ref 11.5–14.7)

## 2011-12-06 LAB — BASIC METABOLIC PANEL
Anion Gap: 8 (ref 7–16)
Calcium, Total: 7.4 mg/dL — ABNORMAL LOW (ref 8.5–10.1)
Co2: 22 mmol/L (ref 21–32)
Creatinine: 0.81 mg/dL (ref 0.60–1.30)
EGFR (African American): >60
EGFR (Non-African Amer.): >60
Osmolality: 298 (ref 275–301)

## 2011-12-06 LAB — CBC WITH DIFFERENTIAL/PLATELET
Basophil #: 0 10*3/uL (ref 0.0–0.1)
Basophil #: 0 10*3/uL (ref 0.0–0.1)
Basophil %: 0.1 %
Basophil %: 0.1 %
Eosinophil #: 0 10*3/uL (ref 0.0–0.7)
Eosinophil #: 0 10*3/uL (ref 0.0–0.7)
Eosinophil %: 0 %
HCT: 28.8 % — ABNORMAL LOW (ref 40.0–52.0)
HCT: 26.9 % — ABNORMAL LOW (ref 40.0–52.0)
HGB: 10 g/dL — ABNORMAL LOW (ref 13.0–18.0)
HGB: 9.3 g/dL — ABNORMAL LOW (ref 13.0–18.0)
Lymphocyte %: 6.6 %
Lymphocyte %: 6.7 %
MCH: 30.2 pg (ref 26.0–34.0)
MCH: 29.9 pg (ref 26.0–34.0)
MCHC: 34.8 g/dL (ref 32.0–36.0)
MCHC: 34.4 g/dL (ref 32.0–36.0)
Monocyte #: 0.9 x10 3/mm (ref 0.2–1.0)
Monocyte #: 0.8 x10 3/mm (ref 0.2–1.0)
Monocyte %: 5.3 %
Monocyte %: 4.4 %
Neutrophil #: 15 10*3/uL — ABNORMAL HIGH (ref 1.4–6.5)
Neutrophil #: 15.6 10*3/uL — ABNORMAL HIGH (ref 1.4–6.5)
Platelet: 148 10*3/uL — ABNORMAL LOW (ref 150–440)
RBC: 3.32 10*6/uL — ABNORMAL LOW (ref 4.40–5.90)
RBC: 3.1 10*6/uL — ABNORMAL LOW (ref 4.40–5.90)
RDW: 16.8 % — ABNORMAL HIGH (ref 11.5–14.5)
RDW: 16.5 % — ABNORMAL HIGH (ref 11.5–14.5)
WBC: 17.1 10*3/uL — ABNORMAL HIGH (ref 3.8–10.6)
WBC: 17.6 10*3/uL — ABNORMAL HIGH (ref 3.8–10.6)

## 2011-12-06 LAB — COMPREHENSIVE METABOLIC PANEL
Albumin: 1.6 g/dL — ABNORMAL LOW (ref 3.4–5.0)
Alkaline Phosphatase: 71 U/L (ref 50–136)
Anion Gap: 6 — ABNORMAL LOW (ref 7–16)
BUN: 23 mg/dL — ABNORMAL HIGH (ref 7–18)
Bilirubin,Total: 1.6 mg/dL — ABNORMAL HIGH (ref 0.2–1.0)
Chloride: 115 mmol/L — ABNORMAL HIGH (ref 98–107)
Creatinine: 0.77 mg/dL (ref 0.60–1.30)
Glucose: 200 mg/dL — ABNORMAL HIGH (ref 65–99)
Osmolality: 298 (ref 275–301)
SGOT(AST): 14 U/L — ABNORMAL LOW (ref 15–37)
SGPT (ALT): 34 U/L (ref 12–78)
Total Protein: 3.9 g/dL — ABNORMAL LOW (ref 6.4–8.2)

## 2011-12-06 LAB — PROTIME-INR
INR: 1.5
Prothrombin Time: 18.8 secs — ABNORMAL HIGH (ref 11.5–14.7)

## 2011-12-06 LAB — APTT: Activated PTT: 30.2 secs (ref 23.6–35.9)

## 2011-12-06 LAB — MAGNESIUM: Magnesium: 1.3 mg/dL — ABNORMAL LOW

## 2011-12-07 LAB — CBC WITH DIFFERENTIAL/PLATELET
Basophil #: 0 10*3/uL (ref 0.0–0.1)
Basophil %: 0.2 %
HCT: 27.1 % — ABNORMAL LOW (ref 40.0–52.0)
HGB: 9.2 g/dL — ABNORMAL LOW (ref 13.0–18.0)
Lymphocyte #: 1.1 10*3/uL (ref 1.0–3.6)
MCH: 29.7 pg (ref 26.0–34.0)
MCHC: 34.1 g/dL (ref 32.0–36.0)
MCV: 87 fL (ref 80–100)
Monocyte #: 0.7 x10 3/mm (ref 0.2–1.0)
Monocyte %: 4.1 %
Neutrophil #: 14.8 10*3/uL — ABNORMAL HIGH (ref 1.4–6.5)
Platelet: 154 10*3/uL (ref 150–440)
RDW: 16.5 % — ABNORMAL HIGH (ref 11.5–14.5)

## 2011-12-07 LAB — COMPREHENSIVE METABOLIC PANEL
Albumin: 1.4 g/dL — ABNORMAL LOW (ref 3.4–5.0)
Anion Gap: 7 (ref 7–16)
BUN: 20 mg/dL — ABNORMAL HIGH (ref 7–18)
Calcium, Total: 7.3 mg/dL — ABNORMAL LOW (ref 8.5–10.1)
Chloride: 113 mmol/L — ABNORMAL HIGH (ref 98–107)
Co2: 23 mmol/L (ref 21–32)
Creatinine: 0.68 mg/dL (ref 0.60–1.30)
EGFR (African American): >60
Glucose: 173 mg/dL — ABNORMAL HIGH (ref 65–99)
Osmolality: 292 (ref 275–301)
SGOT(AST): 13 U/L — ABNORMAL LOW (ref 15–37)
SGPT (ALT): 28 U/L (ref 12–78)

## 2011-12-08 LAB — CBC WITH DIFFERENTIAL/PLATELET
Basophil #: 0 10*3/uL (ref 0.0–0.1)
Basophil %: 0.1 %
Eosinophil #: 0.1 10*3/uL (ref 0.0–0.7)
HCT: 28.8 % — ABNORMAL LOW (ref 40.0–52.0)
HGB: 9.6 g/dL — ABNORMAL LOW (ref 13.0–18.0)
Lymphocyte #: 1.2 10*3/uL (ref 1.0–3.6)
MCH: 29.7 pg (ref 26.0–34.0)
MCHC: 33.3 g/dL (ref 32.0–36.0)
MCV: 89 fL (ref 80–100)
Monocyte #: 0.8 x10 3/mm (ref 0.2–1.0)
Monocyte %: 5.1 %
Neutrophil #: 12.7 10*3/uL — ABNORMAL HIGH (ref 1.4–6.5)
Neutrophil %: 85.9 %
RBC: 3.23 10*6/uL — ABNORMAL LOW (ref 4.40–5.90)
RDW: 16.9 % — ABNORMAL HIGH (ref 11.5–14.5)
WBC: 14.8 10*3/uL — ABNORMAL HIGH (ref 3.8–10.6)

## 2011-12-08 LAB — BASIC METABOLIC PANEL
Anion Gap: 6 — ABNORMAL LOW (ref 7–16)
BUN: 18 mg/dL (ref 7–18)
Calcium, Total: 7.1 mg/dL — ABNORMAL LOW (ref 8.5–10.1)
Chloride: 113 mmol/L — ABNORMAL HIGH (ref 98–107)
Co2: 24 mmol/L (ref 21–32)
Creatinine: 0.77 mg/dL (ref 0.60–1.30)
EGFR (African American): >60
EGFR (Non-African Amer.): >60
Potassium: 3.4 mmol/L — ABNORMAL LOW (ref 3.5–5.1)
Sodium: 143 mmol/L (ref 136–145)

## 2011-12-09 LAB — BASIC METABOLIC PANEL
BUN: 17 mg/dL (ref 7–18)
Chloride: 114 mmol/L — ABNORMAL HIGH (ref 98–107)
Co2: 23 mmol/L (ref 21–32)
Creatinine: 0.7 mg/dL (ref 0.60–1.30)
EGFR (African American): >60
Glucose: 174 mg/dL — ABNORMAL HIGH (ref 65–99)
Sodium: 144 mmol/L (ref 136–145)

## 2011-12-09 LAB — CBC WITH DIFFERENTIAL/PLATELET
Basophil %: 0.1 %
Eosinophil #: 0 10*3/uL (ref 0.0–0.7)
Lymphocyte #: 1.1 10*3/uL (ref 1.0–3.6)
MCH: 30 pg (ref 26.0–34.0)
MCHC: 33.1 g/dL (ref 32.0–36.0)
MCV: 91 fL (ref 80–100)
Monocyte #: 0.7 x10 3/mm (ref 0.2–1.0)
Neutrophil %: 87.2 %
Platelet: 175 10*3/uL (ref 150–440)
RBC: 2.96 10*6/uL — ABNORMAL LOW (ref 4.40–5.90)
RDW: 17.2 % — ABNORMAL HIGH (ref 11.5–14.5)
WBC: 14.9 10*3/uL — ABNORMAL HIGH (ref 3.8–10.6)

## 2011-12-10 LAB — CBC WITH DIFFERENTIAL/PLATELET
Basophil %: 0 %
Eosinophil %: 0.1 %
HCT: 27.9 % — ABNORMAL LOW (ref 40.0–52.0)
HGB: 9.4 g/dL — ABNORMAL LOW (ref 13.0–18.0)
Lymphocyte #: 0.3 10*3/uL — ABNORMAL LOW (ref 1.0–3.6)
MCH: 30.9 pg (ref 26.0–34.0)
MCHC: 33.7 g/dL (ref 32.0–36.0)
MCV: 92 fL (ref 80–100)
Monocyte %: 3.5 %
Neutrophil #: 10.8 10*3/uL — ABNORMAL HIGH (ref 1.4–6.5)
Neutrophil %: 93.4 %
RBC: 3.05 10*6/uL — ABNORMAL LOW (ref 4.40–5.90)
WBC: 11.6 10*3/uL — ABNORMAL HIGH (ref 3.8–10.6)

## 2011-12-10 LAB — BASIC METABOLIC PANEL
BUN: 17 mg/dL (ref 7–18)
Creatinine: 0.68 mg/dL (ref 0.60–1.30)
EGFR (African American): >60
EGFR (Non-African Amer.): >60
Glucose: 111 mg/dL — ABNORMAL HIGH (ref 65–99)
Potassium: 3.8 mmol/L (ref 3.5–5.1)
Sodium: 142 mmol/L (ref 136–145)

## 2011-12-11 LAB — COMPREHENSIVE METABOLIC PANEL
Alkaline Phosphatase: 62 U/L (ref 50–136)
Anion Gap: 8 (ref 7–16)
Calcium, Total: 7.9 mg/dL — ABNORMAL LOW (ref 8.5–10.1)
Chloride: 112 mmol/L — ABNORMAL HIGH (ref 98–107)
Co2: 21 mmol/L (ref 21–32)
EGFR (African American): >60
EGFR (Non-African Amer.): >60
SGOT(AST): 17 U/L (ref 15–37)
SGPT (ALT): 19 U/L (ref 12–78)
Sodium: 141 mmol/L (ref 136–145)

## 2011-12-11 LAB — CBC WITH DIFFERENTIAL/PLATELET
Eosinophil #: 0 10*3/uL (ref 0.0–0.7)
Eosinophil %: 0 %
HCT: 26.3 % — ABNORMAL LOW (ref 40.0–52.0)
HGB: 9 g/dL — ABNORMAL LOW (ref 13.0–18.0)
Lymphocyte #: 0.5 10*3/uL — ABNORMAL LOW (ref 1.0–3.6)
MCH: 31.5 pg (ref 26.0–34.0)
MCHC: 34.3 g/dL (ref 32.0–36.0)
Monocyte #: 0.6 x10 3/mm (ref 0.2–1.0)
Monocyte %: 3.9 %
Platelet: 240 10*3/uL (ref 150–440)
RBC: 2.87 10*6/uL — ABNORMAL LOW (ref 4.40–5.90)
RDW: 19.4 % — ABNORMAL HIGH (ref 11.5–14.5)
WBC: 16.5 10*3/uL — ABNORMAL HIGH (ref 3.8–10.6)

## 2011-12-13 LAB — COMPREHENSIVE METABOLIC PANEL
Alkaline Phosphatase: 63 U/L (ref 50–136)
Anion Gap: 10 (ref 7–16)
BUN: 36 mg/dL — ABNORMAL HIGH (ref 7–18)
Bilirubin,Total: 1.1 mg/dL — ABNORMAL HIGH (ref 0.2–1.0)
Chloride: 114 mmol/L — ABNORMAL HIGH (ref 98–107)
Co2: 18 mmol/L — ABNORMAL LOW (ref 21–32)
Creatinine: 0.96 mg/dL (ref 0.60–1.30)
EGFR (African American): >60
EGFR (Non-African Amer.): >60
Potassium: 5.4 mmol/L — ABNORMAL HIGH (ref 3.5–5.1)
SGPT (ALT): 19 U/L (ref 12–78)
Total Protein: 4.3 g/dL — ABNORMAL LOW (ref 6.4–8.2)

## 2011-12-13 LAB — CBC WITH DIFFERENTIAL/PLATELET
Basophil #: 0 10*3/uL (ref 0.0–0.1)
Eosinophil #: 0 10*3/uL (ref 0.0–0.7)
Eosinophil %: 0.1 %
HCT: 31.8 % — ABNORMAL LOW (ref 40.0–52.0)
Lymphocyte #: 0.5 10*3/uL — ABNORMAL LOW (ref 1.0–3.6)
MCV: 91 fL (ref 80–100)
Monocyte #: 0.8 x10 3/mm (ref 0.2–1.0)
Monocyte %: 6.2 %
Neutrophil #: 11.6 10*3/uL — ABNORMAL HIGH (ref 1.4–6.5)
Platelet: 244 10*3/uL (ref 150–440)
RBC: 3.52 10*6/uL — ABNORMAL LOW (ref 4.40–5.90)
WBC: 13 10*3/uL — ABNORMAL HIGH (ref 3.8–10.6)

## 2011-12-15 LAB — BASIC METABOLIC PANEL
Anion Gap: 9 (ref 7–16)
BUN: 45 mg/dL — ABNORMAL HIGH (ref 7–18)
Calcium, Total: 7.3 mg/dL — ABNORMAL LOW (ref 8.5–10.1)
EGFR (African American): >60
EGFR (Non-African Amer.): 57 — ABNORMAL LOW
Glucose: 114 mg/dL — ABNORMAL HIGH (ref 65–99)
Osmolality: 290 (ref 275–301)
Potassium: 4 mmol/L (ref 3.5–5.1)
Sodium: 139 mmol/L (ref 136–145)

## 2011-12-15 LAB — CBC WITH DIFFERENTIAL/PLATELET
Basophil #: 0 10*3/uL (ref 0.0–0.1)
Basophil %: 0.1 %
Eosinophil #: 0 10*3/uL (ref 0.0–0.7)
Eosinophil %: 0 %
HCT: 23.2 % — ABNORMAL LOW (ref 40.0–52.0)
HGB: 8.1 g/dL — ABNORMAL LOW (ref 13.0–18.0)
Lymphocyte #: 0.5 10*3/uL — ABNORMAL LOW (ref 1.0–3.6)
Lymphocyte %: 5 %
MCH: 31.8 pg (ref 26.0–34.0)
Monocyte #: 0.7 x10 3/mm (ref 0.2–1.0)
Monocyte %: 7.4 %
Neutrophil #: 8.7 10*3/uL — ABNORMAL HIGH (ref 1.4–6.5)
Neutrophil %: 87.5 %
RBC: 2.56 10*6/uL — ABNORMAL LOW (ref 4.40–5.90)
WBC: 10 10*3/uL (ref 3.8–10.6)

## 2012-01-04 DEATH — deceased

## 2012-11-25 IMAGING — US ABDOMEN ULTRASOUND
2 series · 13 of 25 positions shown · non-contrast
Comparison: none

REASON FOR EXAM: liver failure
COMMENTS:   May transport without cardiac monitor

[Series 1: abdomen ultrasound · 0.26mm/px · 12 of 116 slices shown (1 of 2)]
[im 1/116]
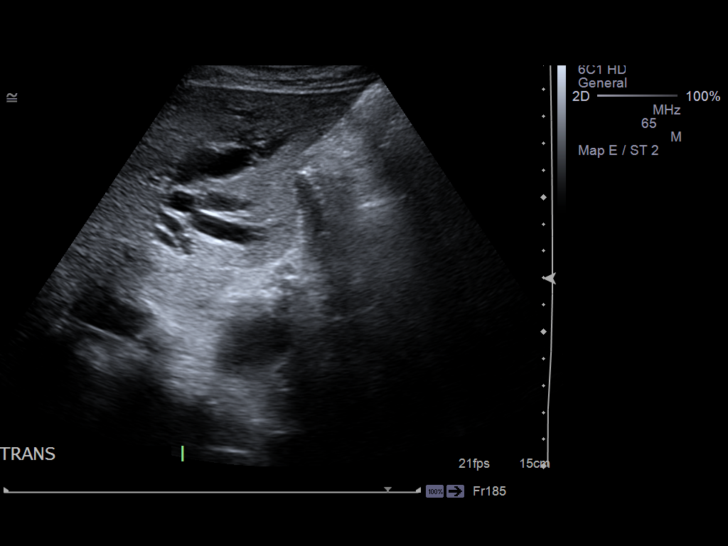
[im 11/116]
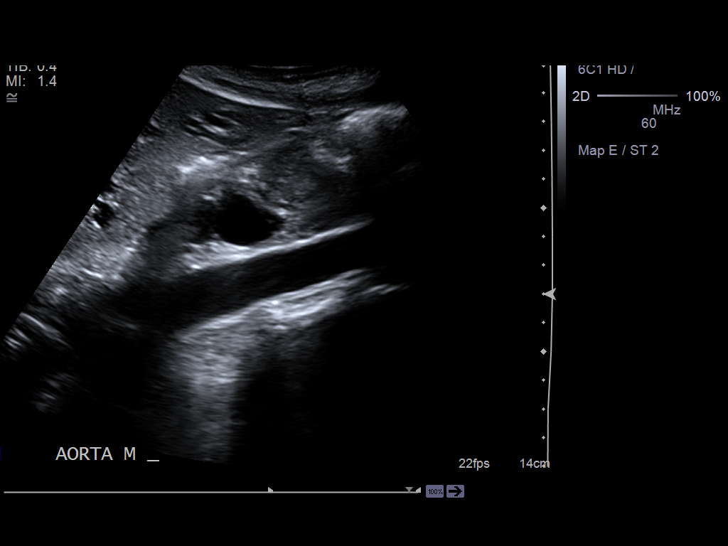
[im 21/116]
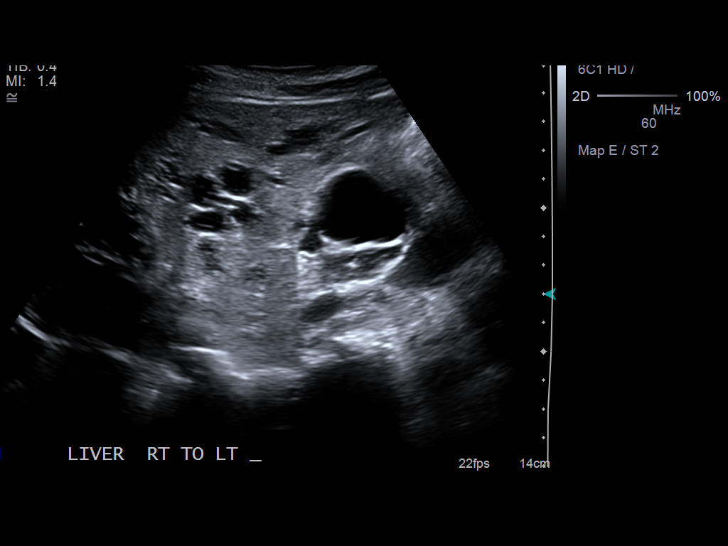
[im 31/116]
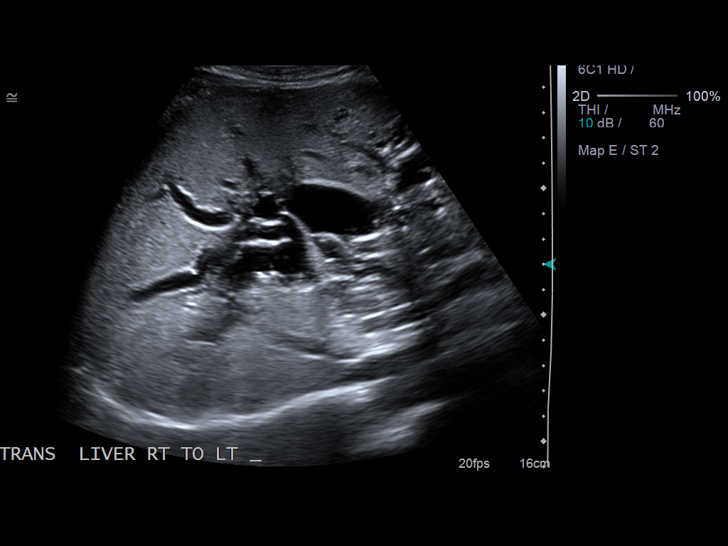
[im 41/116]
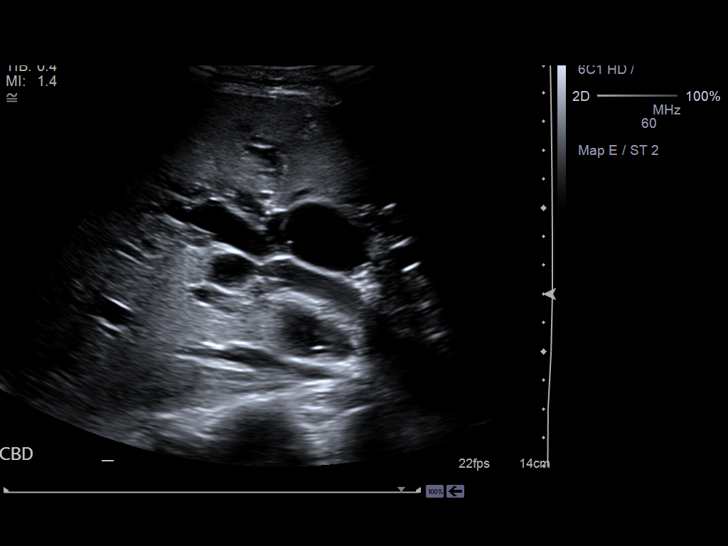
[im 51/116]
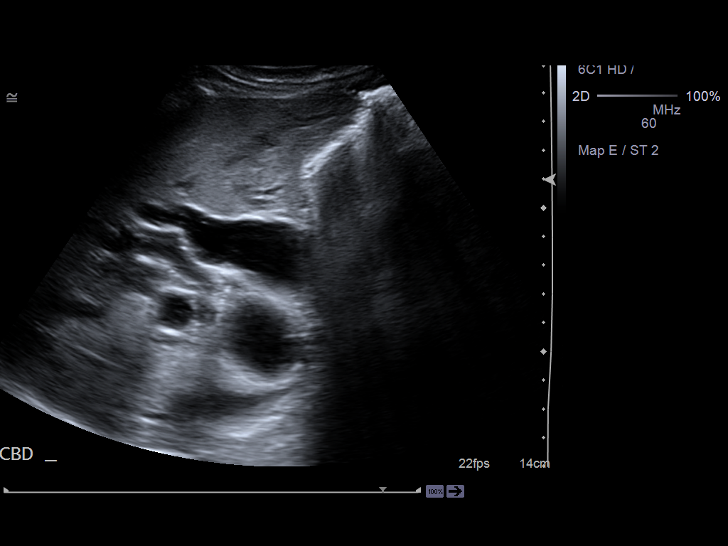
[im 61/116]
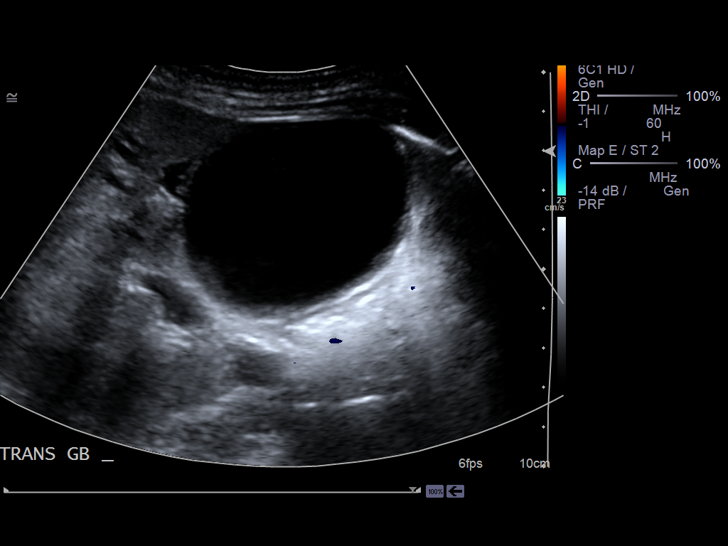
[im 71/116]
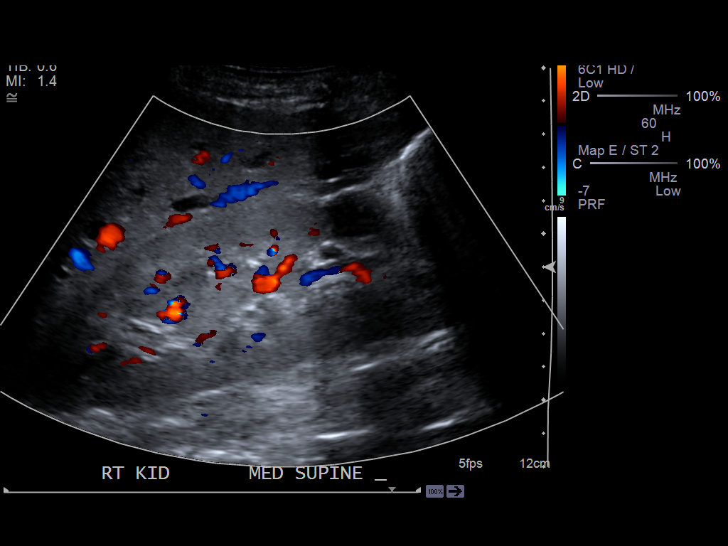
[im 81/116]
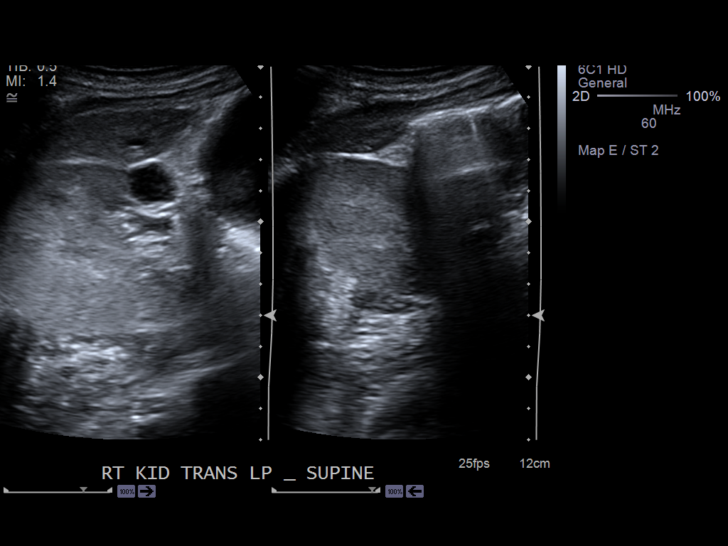
[im 91/116]
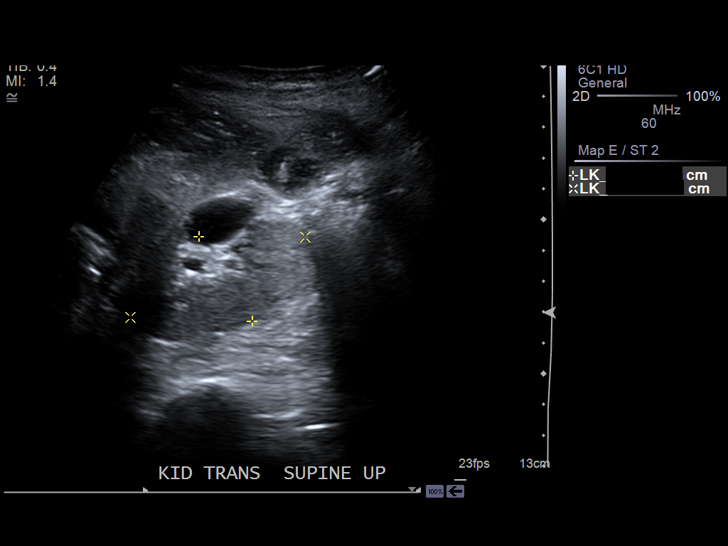
[im 101/116]
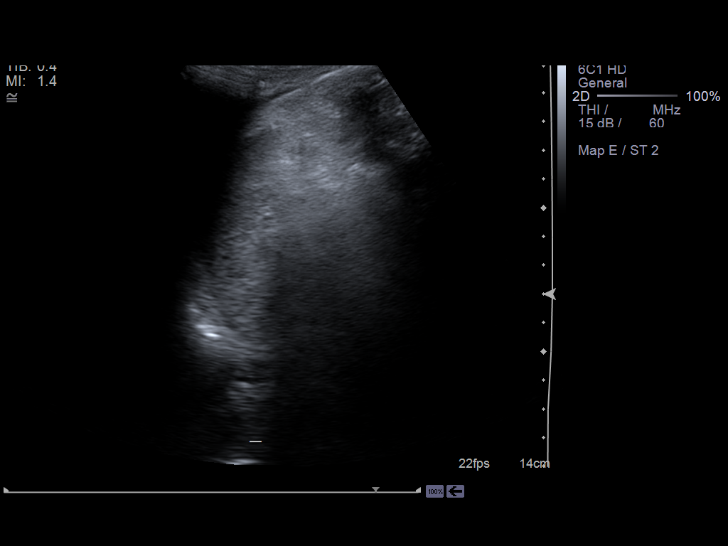
[im 111/116]
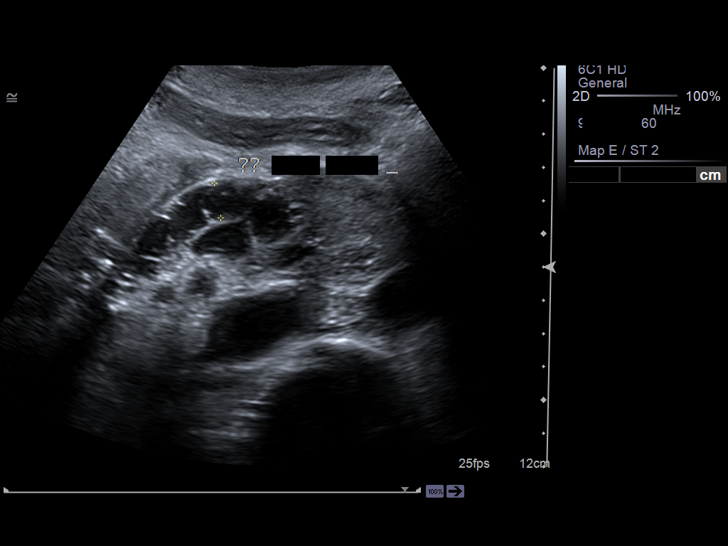

[Series 3: abdomen ultrasound · 0.18mm/px · 1 of 1 slices shown (2 of 2)]
[im 1/1]
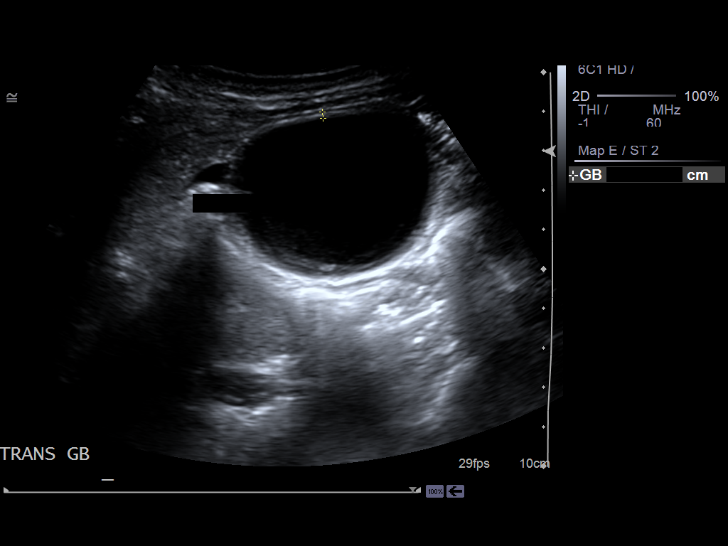

[13 of 25 positions shown; findings below may reference images not displayed]

PROCEDURE:     US  - US ABDOMEN GENERAL SURVEY  - October 22, 2011  [DATE]

RESULT:     The liver demonstrates severe intrahepatic ductal dilation. No
discrete hepatic masses are demonstrated. Portal venous flow remains normal
in direction toward the liver. The common bile duct is dilated measuring
between 1.0 and 2.1 cm. The gallbladder exhibits the presence of sludge and
is distended to as much as 12 cm. The gallbladder wall is minimally
thickened and there is a trace of pericholecystic fluid. There is no
positive sonographic Murphy's sign.

The pancreas could not be adequately demonstrated due to the presence of
bowel gas. Only limited evaluation of the abdominal aorta and inferior vena
cava was attained due to bowel gas. The kidneys demonstrate increased
echogenicity of the cortex. There is no evidence of obstruction. There is a
lower pole cyst on the right measuring 1.2 cm in greatest dimension.
IMPRESSION: 1. There is dilation of the intrahepatic the biliary tree and common bile
duct of uncertain etiology. There is sludge in the gallbladder and the
gallbladder is distended. Adequate assessment of the pancreas was not
possible due to bowel gas. Followup CT scanning is recommended to further
evaluate the pancreas and adjacent structures in an effort to determinethe
etiology for the biliary ductal dilation.
2. Evaluation of the spleen, kidneys, and abdominal aorta reveals no
definite acute abnormality. The echotexture of the renal parenchyma is
increased consistent with medical renal disease.

## 2013-01-05 IMAGING — XA IR VASCULAR PROCEDURE
1 series · 1 of 1 positions shown · non-contrast
Comparison: none

[Series 1: single · 1 of 1 slices shown]
[im 1/1]
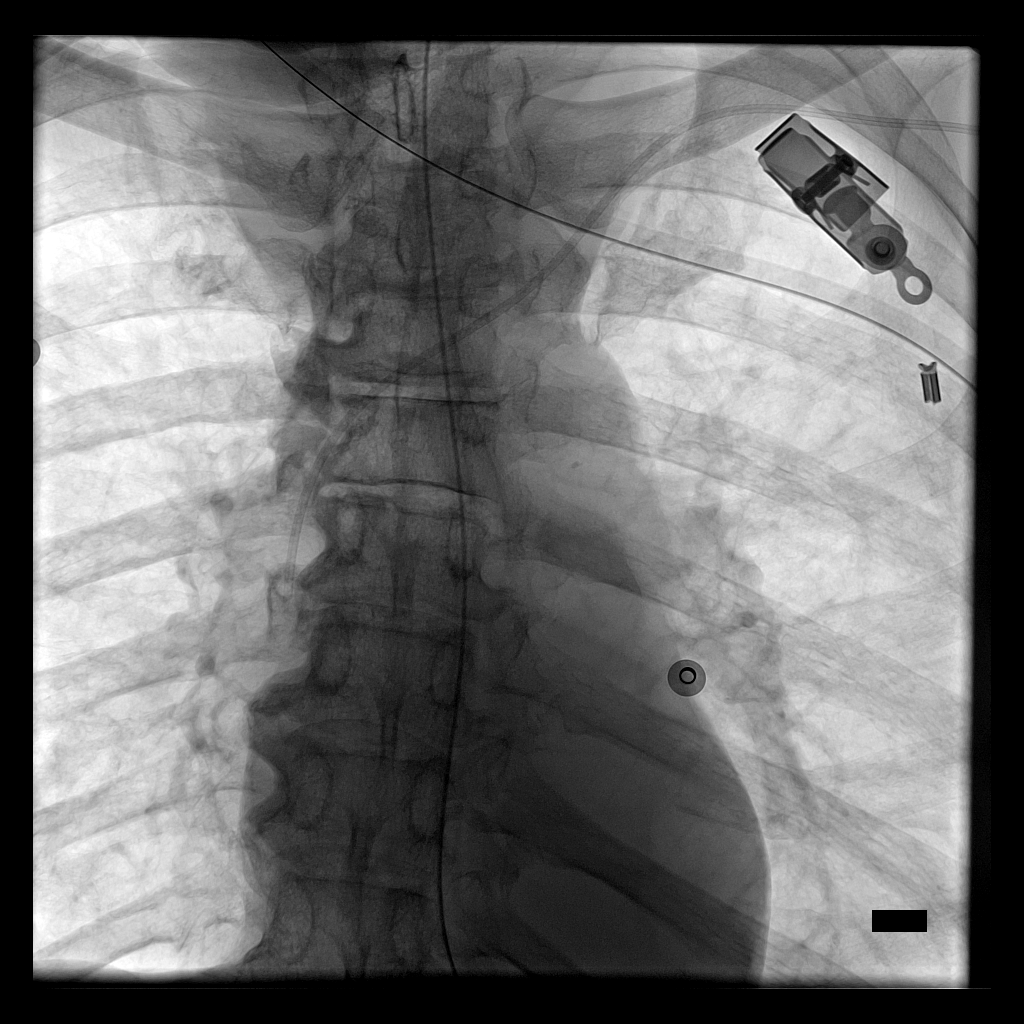

[1 of 1 positions shown; findings below may reference images not displayed]

IMAGES IMPORTED FROM THE SYN[employer] WORKFLOW SYSTEM
NO DICTATION FOR STUDY

## 2013-01-16 IMAGING — CR DG CHEST 1V PORT
1 series · 1 of 1 positions shown · non-contrast
Comparison: none

REASON FOR EXAM: picc line placement
COMMENTS:

PROCEDURE:     DXR - DXR PORTABLE CHEST SINGLE VIEW  - December 13, 2011 [DATE]
RESULT:     Comparison: 11/24/2011

[portable]
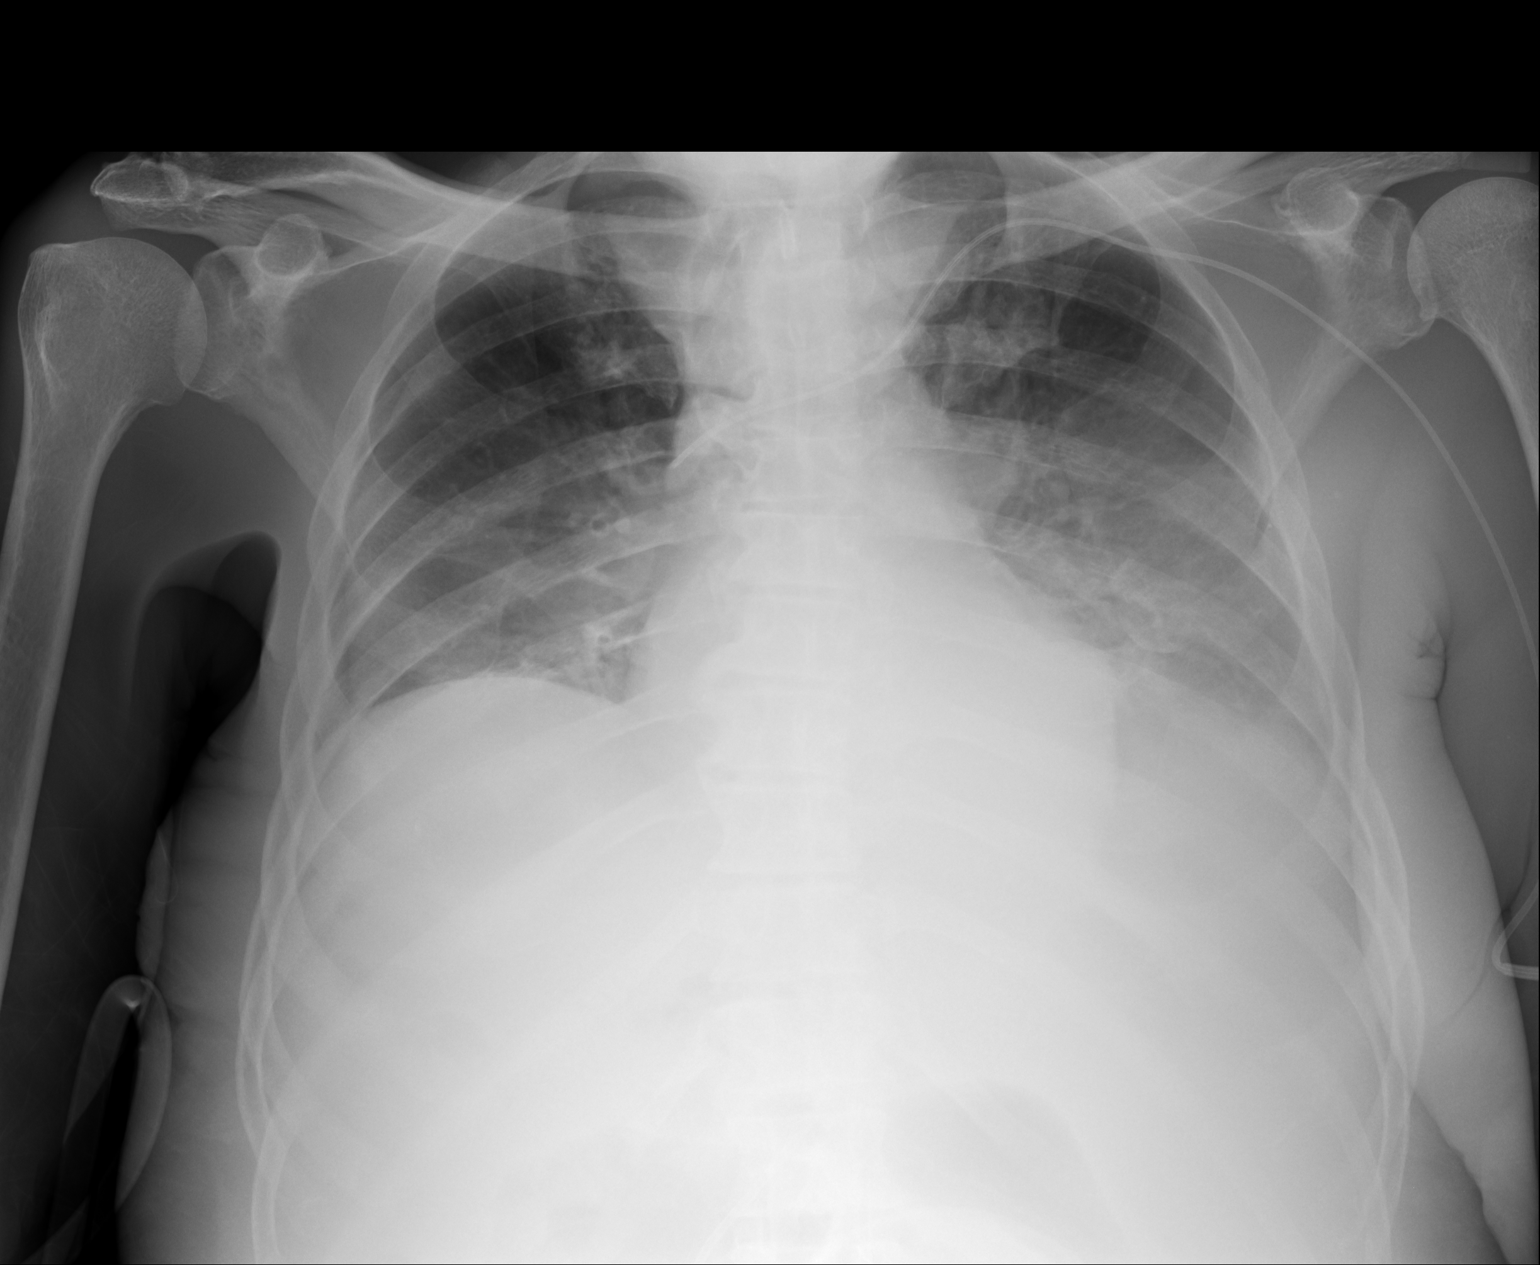

[1 of 1 positions shown; findings below may reference images not displayed]

FINDINGS: PICC catheter tip terminates over the SVC. Heart and mediastinum are stable.
The lung volumes are low. There are hazy opacities in the mid and lower
lungs bilaterally.
IMPRESSION: 1. PICC catheter tip terminates over the SVC.
2. Findings which likely represent a combination of atelectasis and
bilateral layering pleural effusions. Infection is not excluded. Followup PA
and lateral chest radiograph are recommended.

[REDACTED]

## 2013-01-19 IMAGING — XA IR VASCULAR PROCEDURE
1 series · 1 of 1 positions shown · non-contrast
Comparison: none

[Series 1: single · 1 of 1 slices shown]
[im 1/1]
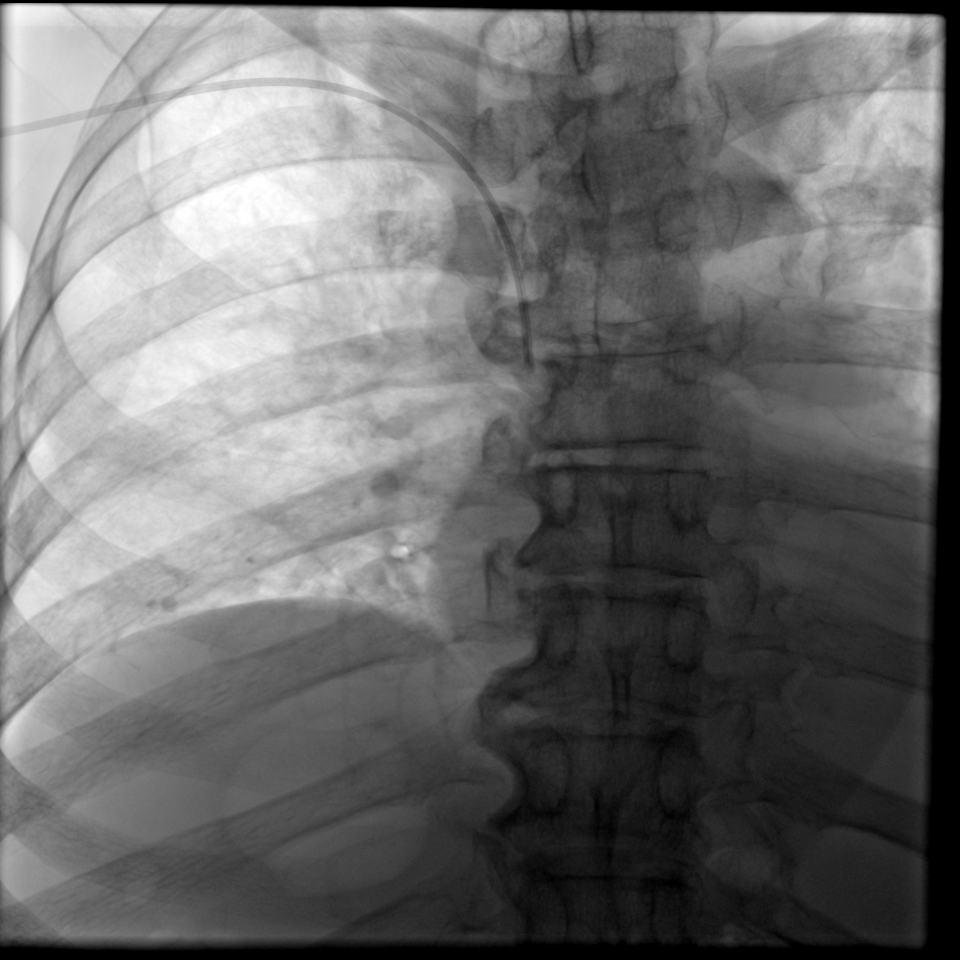

[1 of 1 positions shown; findings below may reference images not displayed]

IMAGES IMPORTED FROM THE SYN[employer] WORKFLOW SYSTEM
NO DICTATION FOR STUDY

## 2014-07-23 NOTE — Consult Note (Signed)
PATIENT NAME:  Corey Ortiz, Corey Ortiz MR#:  [id number] DATE OF BIRTH:  12/16/1955  DATE OF CONSULTATION:  10/28/2011  REFERRING PHYSICIAN:   CONSULTING PHYSICIAN:  Knute Neuobert G. Lorre NickGittin, MD  HISTORY OF PRESENT ILLNESS: Mr. 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 He has had biliary stenting with improvement in the bilirubin down from 26 currently to 6. He was initially brought in with altered mental status and it was stated in the admission history and physical that he had some history of chronic liver disease but this cannot be substantiated, this also given that he has a history of alcohol abuse. The patient denies any alcohol except rarely a beer. He is a smoker. The patient's bilirubin was 28 on admission. He was in acute renal failure with metabolic acidosis. He was treated with fluid support. He had an elevated troponin that was thought to be due to acute renal failure but he had no evidence of acute cardiac event. He had ERCP and stenting. The bilirubin is coming down significantly as is the alkaline phosphatase. The liver enzymes look good. His PT/INR was initially elevated, probably contribution of obstructive liver disease and nutritional deficiency. He has admitted to weight loss but he cannot state how much. He has malnutrition and albumin as low as 1.8. He was confused. He has improved. He has had Psychiatry consultation and he is said to have recovered from metabolic encephalopathy. He currently has some depression but he's mentally clear and competent to make decisions as per Psychiatry.   SOCIAL HISTORY: Again, he smokes half a pack of cigarettes a day.   FAMILY HISTORY: Denied any cancer in the family.   MEDICATIONS: He was not on any medicines at the time of admission.   ALLERGIES: No known drug allergies.   PHYSICAL EXAMINATION:   GENERAL: He is alert and cooperative, very cachectic. No thrush.   NECK: No  mass in the neck.   LYMPH: No palpable lymph nodes in the neck, supraclavicular, submandibular, or axilla.   LUNGS: Clear. No wheezing or rales.   ABDOMEN: Firm, nontender. No palpable mass or organomegaly.   LUNGS: Clear. No wheezing or rales.   EXTREMITIES: No edema or calf tenderness.   NEUROLOGIC: Alert and cooperative. No gross focal weakness. He is oriented.   SKIN: No rash.   LABS ON ADMISSION: Creatinine was 2.6, recently down to normal 0.82. He had low potassium on admission and low bicarbonate. His ammonia was 80 on admission. His alcohol level was less than 0.03. His albumin was 2, is still 1.8. His bilirubin was 28, down to 6. His alkaline phosphatase was 1769. Liver enzymes were up and they are just about normal. His white count was 12.1, hemoglobin 7.3, platelets 406. His hemoglobin dropped as low as 6.5. He had 2 units of blood given. CT of the head was unremarkable. CT of the abdomen showed a pancreatic head mass. This was a noncontrast CT. On ERCP he had an ampullary mass and this was biopsied and the result is carcinoma.   IMPRESSION AND PLAN: The patient has: 1. 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 He has only had a noncontrast CT scan. It is currently potentially resectable with no evidence of nodes or liver mets or neurovascular invasion. He does not have any back pain. He has some mid abdominal pain.   2. He has weight loss that may be attributed to prolonged poor condition  and not eating, some anorexia. Also, he has been so weak he says that he really could not get to the food or take care of himself or prepare any food.  3. Resolved acute renal failure due to dehydration.  4. Anemia, appears multifactorial. Clearly has malignancy and poor nutrition and he may now have some blood loss from biopsy. There was no evidence of acute bleed. He had very high nucleated red blood cells a week ago and there may have been intercurrent bleed and there may have been a  hemolytic crisis but hemoglobin was holding up recently after hydration and after transfusion.   PLAN:  1. Would continue to follow his CBC, liver chemistries, and creatinine.  2. Would check the potassium, magnesium, and his phosphorus.  3. Would check stool guaiacs, although the patient might turn positive from the biopsy and the GI procedure.  4. Will check iron studies and B12.  5. Will check electrophoretic studies.  6. Will check a Coombs test.  7. When the patient has maintained a normal creatinine and good hydration, would go ahead and get a contrast CT scan with pancreatic protocol and at least noncontrast chest CT to stage him and if he had the appearance of potentially resectable cancer then he should also have endoscopic ultrasound that can get additional staging information. Again, if there is no other pathology and no metastatic disease, we can consult Surgery. Otherwise, look at a candidate for FOLFOX or FOLFIRINOX treatment initially with radiation and 5-FU treatment for palliation.   ____________________________ Knute Neu Lorre Nick, MD rgg:drc D: 10/28/2011 20:19:31 ET T: 10/29/2011 09:15:54 ET JOB#: [id number]  cc: Knute Neu. Lorre Nick, MD, <Dictator> Marin Roberts MD ELECTRONICALLY SIGNED 11/24/2011 12:57

## 2014-07-23 NOTE — Consult Note (Signed)
PATIENT NAME:  Corey Ortiz, Corey Ortiz MR#:  [id number] DATE OF BIRTH:  10/29/1955  DATE OF CONSULTATION:  11/24/2011  REFERRING PHYSICIAN:   CONSULTING PHYSICIAN:  Corey Barre, NP  PRIMARY CARE PHYSICIAN:  Non-local  HISTORY OF PRESENT ILLNESS:  Corey Ortiz is a 59 year old African American male with a history of recent diagnosis of pancreatic cancer, history of alcohol abuse, and tobacco abuse. He was admitted to the hospital today after presenting to the Emergency Department with nausea and coffee-ground emesis. The patient is not speaking at present and there is no family available. Evidently he has been agitated and somewhat combative today and has been sedated with morphine and Ativan. Details of illness are gleaned from nursing staff and chart. The patient has been diagnosed with pancreatic cancer last month. He did undergo ERCP 07/19 which revealed quite a large tumor.  A stent was placed. He has been following with Dr. Lorre Nick and eventually had seen Dr. Madaline Brilliant as well. Today he went to [facility] to have repeat ERCP for unknown reason but was hypotensive so was sent home. He became confused so the family brought him to the Emergency Department here and he was admitted with elevated troponins and some EKG changes in the anterolateral leads. He has also had some nausea and coffee-ground emesis, none at present.  Although the patient opens his eyes spontaneously and moves all extremities times four, he will not answer questions, so I am unable to assess abdominal pain or other details.   PAST MEDICAL HISTORY:  Recent diagnosis pancreatic cancer, biliary obstruction status post stent, history of alcohol and tobacco abuse in the past, anemia, renal failure, malnutrition, and leukocytosis.   MEDICATIONS: 1. Remeron 50 mg p.o. at bedtime.  2. Omeprazole 20 mg p.o. daily.   FAMILY HISTORY: Unknown. Per the History and Physical there is no document of coronary artery disease or malignancy in the family.    SOCIAL HISTORY: Positive for alcohol and tobacco, unsure about rest.   ALLERGIES: No known drug allergies.   REVIEW OF SYSTEMS: Unable to obtain at this time.  MOST RECENT LAB WORK: Glucose 96, BUN 30, creatinine 1.29, sodium 145, potassium 2.9, chloride 103, GFR greater than 60, magnesium 1.8. No ethanol. Serum protein 5.8, albumin 2.2, bilirubin 3.5. Alkaline phosphatase 170, this is an improvement from a few days ago. AST 37, ALT 33, troponin 0.29. TSH 0.68. WBC 9.6, hemoglobin 11.4, platelets 255, PT 14.9, INR 1.1, acetaminophen less than 2, salicylates less than 1.7. Chest x-ray negative for acute abnormalities. CT negative for acute abnormalities. EKG with questionable anterolateral ischemia. Otherwise normal sinus rhythm.   PHYSICAL EXAMINATION:  VITAL SIGNS: Most recent vital signs: Temperature 97.9, pulse 71, respiratory rate 18, blood pressure 148/82, oxygen saturation 94% on room air. Monitor with normal sinus rhythm with PVCs. It is noteworthy that the patient is receiving a potassium bolus at this time.   GENERAL: The patient is somewhat drowsy but alert, refuses to answer questions, does not appear in any acute distress at this time.   PSYCH: Uncooperative, calm.   HEENT: Normocephalic, atraumatic. Sclerae are jaundiced. No redness, drainage, or inflammation to the eyes or the nares. No obvious sores to the lip.   NECK: Trachea midline. No JVD, lymphadenopathy, thyromegaly.  RESPIRATORY: Respirations are eupneic. Lungs are clear to auscultation and percussion.   CARDIOVASCULAR: S1, S2. Regular rate and rhythm. No murmurs, rubs, or gallops.  Peripheral pulses 2+. No edema.   ABDOMEN: Flat, soft, hypoactive bowel sounds times four,  nondistended. No apparent tenderness, i.e. no flinching, grimacing, or guarding.   GENITOURINARY: Deferred.   RECTAL: Deferred.   EXTREMITIES: No edema clubbing, or cyanosis. Pedal pulses 2+ bilaterally.   SKIN: No erythema, lesion, or rash.    NEUROLOGIC:  Difficult to assess cranial nerves due to the patient not following commands at this time. Unable to assess speech. He does move all extremities times four purposefully, opens his eyes spontaneously. Pupils appear equal.   IMPRESSION AND PLAN:  History of coffee-ground emesis, nausea, and pancreatic cancer concerning for GI bleeding. Sources include gastric body, esophagus, pancreatic tumor itself. Due to elevated troponin and EKG changes, we will need cardiac evaluation prior to any procedures. Do recommend twice a day IV PPIs, serial hemoglobins, and transfusing p.r.n.  As the patient has been uncooperative, a barium study would be difficult at present. Further recommendations to follow. Should also consider involving Dr. Lorre NickGittin due to pancreatic cancer.  Would also recommend correcting electrolyte imbalances.   These services were provided by Vevelyn Pathristiane Danaiya Steadman, MSN, [facility] in collaboration with Dr. Lutricia FeilPaul Oh, with whom I have discussed this patient in full.    ____________________________ Corey Barrehristiane H. Teyonna Plaisted, NP chl:bjt D: 11/24/2011 19:15:20 ET T: 11/24/2011 20:21:17 ET JOB#: [id number]  cc: Corey Barrehristiane H. Yul Diana, NP, <Dictator> Eustaquio MaizeHRISTIANE H Dennisse Swader FNP ELECTRONICALLY SIGNED 11/30/2011 16:21

## 2014-07-23 NOTE — Consult Note (Signed)
Brief Consult Note: Diagnosis: GOO.   Patient was seen by consultant.   Consult note dictated.   Recommend further assessment or treatment.   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 will follow but may benefit from PEG vs open bypass.  Electronic Signatures: Lattie Hawooper, Nguyen Todorov E (MD)  (Signed 13-Aug-13 23:18)  Authored: Brief Consult Note   Last Updated: 13-Aug-13 23:18 by Lattie Hawooper, Symphonie Schneiderman E (MD)

## 2014-07-23 NOTE — Consult Note (Signed)
Patient is vomiting,  repeatedly, cxr did not show significant left pleural effusion. But patient is to have ERCP, today and advised to proceed with it as LVEF was normal, eventhough had mildly elevated troponin. His mildly elevated troponin may have bee from non-cardiac source, and his GI issues are the mainly causing his symptoms. Will follow with you as needed. Thanks.  Electronic Signatures: Radene KneeKhan, Keshayla Schrum Ali (MD)  (Signed on 23-Aug-13 12:51)  Authored  Last Updated: 23-Aug-13 12:51 by Radene KneeKhan, Syreeta Figler Ali (MD)

## 2014-07-23 NOTE — Consult Note (Signed)
Brief Consult Note: Diagnosis: delirium due to multiple medical factors.   Patient was seen by consultant.   Consult note dictated.   Recommend to proceed with surgery or procedure.   Recommend further assessment or treatment.   Orders entered.   Comments: Psychiatry: Patient seen. Spoke with paliative care provider. Information from patient and chart. Currently patient is intermittantly confused but when given a chance is capable of understanding his condition and making reasonable judgements. HOWEVER this may be subject to change at any time. Will order IV haldol as prn though at least now he doesn't need it. I strongly advise involving his daughter or closest relative in his care as there may at any time be a change to where he would not be able to make decisions.  Electronic Signatures: Audery Amellapacs, John T (MD)  (Signed 15-Aug-13 18:06)  Authored: Brief Consult Note   Last Updated: 15-Aug-13 18:06 by Audery Amellapacs, John T (MD)

## 2014-07-23 NOTE — Op Note (Signed)
PATIENT NAME:  Corey Ortiz, Corey Ortiz MR#:  [id number] DATE OF BIRTH:  Oct 15, 1955  DATE OF PROCEDURE:  12/02/2011  PREOPERATIVE DIAGNOSES:  1. Pancreatic cancer.  2. Altered mental status.  3. Poor venous access.   POSTOPERATIVE DIAGNOSES:  1. Pancreatic cancer.  2. Altered mental status.  3. Poor venous access.   PROCEDURES:  1. Ultrasound guidance for vascular access to left brachial vein.  2. Fluoroscopic guidance for placement of catheter.  3. Insertion of peripherally inserted central venous catheter, triple lumen, left arm.  SURGEON: Festus BarrenJason Kaeson Kleinert, MD   ANESTHESIA: Local.   ESTIMATED BLOOD LOSS: Minimal.   INDICATION FOR PROCEDURE: The patient is a 59 year old male with pancreatic cancer who has poor venous access and is in need of good venous access.  The risks and benefits were discussed.  DESCRIPTION OF PROCEDURE: The patient's left arm was sterilely prepped and draped, and a sterile surgical field was created. The left brachial vein was accessed under direct ultrasound guidance without difficulty with a micropuncture needle and permanent image was recorded. 0.018 wire was then placed into the superior vena cava. Peel-away sheath was placed over the wire. A single lumen peripherally inserted central venous catheter was then placed over the wire and the wire and peel-away sheath were removed. The catheter tip was placed into the superior vena cava and was secured at the skin at 42 cm with a sterile dressing.   The catheter withdrew blood well and flushed easily with heparinized saline. The patient tolerated procedure well.  ____________________________ Annice NeedyJason S. Maurisha Mongeau, MD jsd:cbb D: 12/02/2011 15:32:03 ET T: 12/02/2011 18:06:07 ET JOB#: [id number]  cc: Annice NeedyJason S. Crestina Strike, MD, <Dictator> Annice NeedyJASON S Urania Pearlman MD ELECTRONICALLY SIGNED 12/08/2011 10:03
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
## 2014-07-23 NOTE — Discharge Summary (Signed)
PATIENT NAME:  Corey Ortiz, Corey Ortiz MR#:  [id number] DATE OF BIRTH:  [birth year]  DATE OF ADMISSION:  10/21/2011 DATE OF DISCHARGE:  11/01/2011  PRIMARY CARE PHYSICIAN: The patient's primary care physician will be Dr. Lorre NickGittin of Oncology    Please see interim discharge summary dictated by Dr. Cherlynn KaiserSainani on 10/29/2011.   FINAL DIAGNOSES:  1. Pancreatic mass, most likely pancreatic cancer.  2. Biliary obstruction status post stent.  3. Encephalopathy, which had improved.  4. Anemia.  5. Acute renal failure, improved.  6. Elevated troponin.  7. Malnutrition.  8. Leukocytosis.  9. Fever.   MEDICATIONS ON DISCHARGE:  1. Remeron 15 mg at bedtime.  2. Omeprazole 20 mg daily.   FOLLOW-UP:  1. Follow-up with home health PT. 2. Follow-up with Dr. Lorre NickGittin Wednesday at 8:30 a.m. 3. Follow-up with Dr. Bluford Kaufmannh in 2 to 4 weeks for stent change.   REASON FOR ADMISSION: Please see interim summary dictated by Dr. Cherlynn KaiserSainani for hospital course up to that point, July 26th. From July 27th to the 29th, the patient's mental status had improved. The patient walked well with physical therapy who recommended home with home health. I spoke with Dr. Lorre NickGittin. He will set up outpatient endoscopic ultrasound to see if the patient is a surgical candidate for his pancreatic mass and cancer versus a chemotherapy candidate with that endoscopic ultrasound. His last hemoglobin is 8.8. He was given a shot of Procrit prior to leaving. He did have a glucose of 89, BUN 8, creatinine 0.84, sodium 141, potassium 4.2, chloride 108, calcium 7.8, total bilirubin 5.5, alkaline phosphatase 561, ALT 45, AST 23. Albumin was 1.8. He had a CT scan of the chest and abdomen with contrast on 10/30/2011 that showed no evidence of acute cardiopulmonary abnormality, large multinodular goiter, pancreatic head mass 3.7 cm, stent present in the common bile duct. B12 is over 1000, retic count 16.38, ferritin 2464.   HOSPITAL CONSULTANTS:  1. Physical therapy   2. Dr. Bluford Kaufmannh, Gastroenterology  3. Dr. Lorre NickGittin, Oncology  4. Dr. Jennet MaduroPucilowska, Psychiatry   HOSPITAL COURSE PER PROBLEM LIST:  1. For the patient's pancreatic mass, most likely this is a pancreatic cancer. Pathology from the ERCP showed carcinoma within the lymphatic spaces, inflamed small intestinal mucosa with surface epithelial changes indefinite for dysplasia. The patient also had a CA-19-9 which was 2820. Most likely this is a pancreatic cancer. Dr. Lorre NickGittin will see on Wednesday and set up for endoscopic ultrasound to see if surgical candidate or not. Most likely will end up needing a port at some point in time for chemotherapy if not a surgical candidate.  2. For the patient's biliary obstruction, Dr. Bluford Kaufmannh did an ERCP and had a stent placement on 10/22/2011. LFTs trended down. The patient still does have jaundice at this point in time with bilirubin of 5.5, alkaline phosphatase 561. Other liver function tests ALT and AST are normal. Albumin is poor at 1.8.  3. For the patient's anemia, this is anemia of chronic disease with a pancreatic malignancy. The patient was given a shot of Procrit. Last hemoglobin 8.8.  4. For the patient's acute renal failure, this had improved during the hospital stay with IV fluid hydration. Creatinine upon discharge 0.84.  5. For the patient's elevated troponin, most likely supply/demand ischemia from being ill when he first came in.  6. Malnutrition secondary to pancreatic cancer. The patient is tolerating diet and eating well.  7. Leukocytosis, could be secondary to the pancreatic malignancy but last white count actually  came back normal at 7.1.  8. Fever, could be secondary to malignancy, patient afebrile upon discharge but has had low-grade temperatures of 99 during the hospital course.  TIME SPENT ON DISCHARGE: 35 minutes.     OVERALL PROGNOSIS: Poor. Close clinical follow-up with Dr. Lorre Nick is a must.   ____________________________ Herschell Dimes. Renae Gloss,  MD rjw:drc D: 11/01/2011 16:09:03 ET T: 11/02/2011 14:26:06 ET JOB#: [id number]  cc: Herschell Dimes. Renae Gloss, MD, <Dictator> Knute Neu. Lorre Nick, MD Ezzard Standing. Bluford Kaufmann, MD Salley Scarlet MD ELECTRONICALLY SIGNED 11/11/2011 13:36

## 2014-07-23 NOTE — Consult Note (Signed)
Pt seen and examined. See C.London's notes. Pt not responding. Elevated troponin. Low K. Await cardiol evaluation before considering EGD/ERCP. Will follow. Thanks  Electronic Signatures: Lutricia Feilh, D'Arcy Abraha (MD)  (Signed on 21-Aug-13 21:33)  Authored  Last Updated: 21-Aug-13 21:33 by Lutricia Feilh, Meeah Totino (MD)

## 2014-07-23 NOTE — Discharge Summary (Signed)
PATIENT NAME:  Corey Ortiz, Corey Ortiz  DATE OF ADMISSION:  11/16/2011 DATE OF DISCHARGE:  11/17/2011  Patient left AMA November 17, 2011.    DIAGNOSES:  1. Possible gastric outlet obstruction due to pancreatic cancer, nausea, vomiting, abdominal pain, likely due to combination of gastric outlet obstruction or pancreatic cancer.  2. Elevated blood pressure possibly related to stress and pain.  3. Leukocytosis.  4. Anemia. 5. Reactive thrombocytosis.   6. Hypoglycemia. 7. Dehydration. 8. Mild hyponatremia. 9. Hypokalemia. 10. Malnutrition. 11. Elevated ALT. 12. Pancreatic cancer.  13. Biliary obstruction, status post biliary stent. 14. History of smoking and alcohol abuse.   DISPOSITION: The patient signed out against medical advice.   CONSULTANTS:  Palliative care consultation, surgical consultation, oncology, and GI consultations were pending but patient left AMA.  RESULTS: CT of the abdomen showed severely distended stomach, small bowel decompressed transition point at the level of the biliary stent. Abdominal x-ray shows pneumobilia, pancreatic duct stent is present, no definite bowel obstruction. Fairly significant air fluid level in the stomach, concern for gastric outlet obstruction. UA showed no evidence of infection. White count 14.3, hemoglobin 11, platelet 519,000,  potassium 2.9, sodium 146, glucose 149, BUN 36, creatinine 1.25. Rest of BMP normal. Total protein 86.3, albumin 2.3, bilirubin 4.7, AST 296.     HOSPITAL COURSE: The patient is a 59 year old male who was recently diagnosed with pancreatic cancer. He had presented with obstructive jaundice and had a stent placed. He presented with nausea, vomiting, and abdominal pain. CAT scan of the abdomen shows gastric distention. He was evaluated by surgeon who recommended placing an NG tube because of concern for gastric outlet obstruction due to pancreatic cancer. The surgeon was going to  decide on BiPAP versus PEG tube placement. The nausea, vomiting, abdominal pain were possibly due to a combination of gastric outlet obstruction and pancreatic cancer. The patient also had elevated blood pressure which was felt to be due to stress and pain. He had stable leukocytosis, anemia, and hyperglycemia. He was hydrated with IV fluids and provided with p.r.n. analgesics and antiemetics. He had mild hyperglycemia. Hyponatremia which had resolved with fluids and his hyperkalemia was being aggressively displaced. He had elevated ALT and bilirubin possibly due to biliary obstruction resulting from pancreatic cancer. During the hospitalization the patient was very irate, belligerent, and aggressive.  He did not want the NG tube placed. He repeatedly threatened to leave the hospital and was unwilling to cooperate with medical management although he was reassured multiple times that his concerns would be addressed. He was unwilling to have a direct conversation regarding the management of his current medical problems and unwilling to cooperative with the advised medical care. In spite of repeated counseling by Patient Relations and physicians, patient left against medical advice.   TIME SPENT: 45 minutes.     ____________________________ Darrick MeigsSangeeta Wyman Meschke, MD sp:vtd D: 11/17/2011 14:59:00 ET T: 11/18/2011 13:01:09 ET JOB#: [id number]  cc: Darrick MeigsSangeeta Lynnleigh Soden, MD, <Dictator> Darrick MeigsSANGEETA Kasondra Junod MD ELECTRONICALLY SIGNED 11/18/2011 15:10

## 2014-07-23 NOTE — Consult Note (Signed)
Brief Consult Note: Diagnosis: jaundice.   Patient was seen by consultant.   Recommend to proceed with surgery or procedure.   Recommend further assessment or treatment.   Discussed with Attending MD.   Comments: Patient seen and examined, full consult to follow.  Patietn admitted with ams.  Elevated lfts in obstructive pattern, with elevated ammonia on admission.  Ammonia now lower, but patient still with some AMS.  Patient is markedly confabulatory although at times gives appropriate responses.  Elevated lfts with dilated cbd and intrahepatic ducts.  Mild ruq discomfort, no revount.  patient with elevated wbc as well some concern for possible cholangitis thoughj patient is afebrile.  CT abdomen also done, but results are still pending.  Patient states [facility], and records have been requested.  Will discuss ERCP with Dr Bluford Kaufmannh, as above considerations. There are no family members locally per patient.  I have discussed the risks benefits and complications of ercp to include not limited to bleeding infection perforation and sedation as well as the unique risk of pancreatitis and he wishes to proceed.  Electronic Signatures: Barnetta ChapelSkulskie, Martin (MD)  (Signed 19-Jul-13 12:36)  Authored: Brief Consult Note   Last Updated: 19-Jul-13 12:36 by Barnetta ChapelSkulskie, Martin (MD)

## 2014-07-23 NOTE — Consult Note (Signed)
PATIENT NAME:  Corey Ortiz, Jahkari L MR#:  [id number] DATE OF BIRTH:  Feb 09, 1956  DATE OF CONSULTATION:  11/24/2011  REFERRING PHYSICIAN:  Dr. Imogene Burnhen CONSULTING PHYSICIAN:  Verta EllenMonica A. Lenon Kuennen, PA-C  PRIMARY CARE PHYSICIAN:  Nonlocal.   REASON FOR CONSULTATION: Elevated troponin I.   HISTORY OF PRESENT ILLNESS: Mr. Ranae PalmsWillie Lee Felty is a 59 year old African American male with a history of pancreatic cancer who is not able to answer any questions or provide any history. History of this patient is taking via the chart. Apparently, the patient was [facility] for ERCP secondary to gastric outlet obstruction and had biliary stent placed. Afterwards, the patient became very confused, had nausea and vomiting, as well as hypotension and was brought to the Emergency Department. During his admission here, he was found to have elevated troponin and hypokalemia.   PAST MEDICAL HISTORY:  1. Pancreatic cancer with gastric outlet obstruction.  2. Biliary obstruction status post stenting.  3. History of alcohol and tobacco abuse.  4. Anemia.  5. History of renal failure. 6. Malnutrition.  7. History of leukocytosis.   ALLERGIES: No known drug allergies.   MEDICATIONS:  1. Remeron 50 mg p.o. at bedtime.  2. Omeprazole 20 mg p.o. daily.   FAMILY HISTORY: Unable to be obtained. Previous documents show no history of coronary artery disease or malignancy.   SOCIAL HISTORY: Unable to be obtained.   REVIEW OF SYSTEMS: Unable to be obtained.   PHYSICAL EXAMINATION:  GENERAL: This is a cachectic, ill-appearing male who is only alert by opening his eyes. He pulled his sheet over his head today and refused examination.   VITAL SIGNS: Temperature 97.9 degrees Fahrenheit, heart rate 71, respiratory rate 18, blood pressure 148/82, oxygen saturation 94% on room air.   ANCILLARY DATA: Glucose 96, BUN 30, creatinine 1.29, sodium 149, potassium 2.9, chloride 103, CO2 35, estimated GFR is greater than 60.  Total calcium is 8.6, magnesium 1.8. Lipase 28, ammonia is less than 25. Ethanol is less than 3. Total protein is 5.8. Albumin 2.2. Total bilirubin is 3.5. Alkaline phosphatase 170, AST 37, ALT 33. Troponin-I is 0.29 TSH 0.69. White blood cell count 9.6, hemoglobin 11.4, hematocrit 34.9, platelet count 255,000. PT 14.9. INR 1.1. Acetaminophen level is less than 2. Salicylates less than 1.7. EKG: Sinus rhythm with premature supraventricular complexes, nonspecific ST and T wave changes anterolaterally, prolonged QTc of 548 milliseconds. Head CT: No acute abnormality. No change since 10/21/2011. Chest x-ray from 08/21: No acute disease of the chest.   ASSESSMENT/PLAN:  1. Pancreatic cancer with gastric outlet obstruction status post biliary stenting [facility].  2. Elevated troponin-I. Elevation of troponin-I could be from hypoxia during procedure. Unable to obtain review of systems to see if the patient has had any associated chest pain. Since he had refused examination today, will cycle cardiac enzymes and get echocardiogram to rule out wall motion abnormalities if the patient allows.   We will continue to follow this patient with you. Thank you very much for this consultation and allowing us to anticipate in the patient's care. This patient was discussed with Dr. Adrian BlackwaterShaukat Khan who agrees with the assessment and management as mentioned above.   ____________________________ Verta EllenMonica A. Sahvanna Mcmanigal, PA-C mam:ap D: 11/24/2011 17:24:30 ET T: 11/25/2011 09:00:36 ET JOB#: [id number] cc: Verta EllenMonica A. Del Wiseman, PA-C, <Dictator> Carlis Blanchard A Monserrath Junio PA ELECTRONICALLY SIGNED 11/26/2011 15:10

## 2014-07-23 NOTE — Consult Note (Signed)
Chief Complaint:   Subjective/Chief Complaint patient states less abdominal discomfort.  continues to be markedly confabulatory.  tolerating clears.   VITAL SIGNS/ANCILLARY NOTES: **Vital Signs.:   20-Jul-13 14:52   Vital Signs Type Routine   Temperature Temperature (F) 97.7   Celsius 36.5   Temperature Source Oral   Pulse Pulse 77   Respirations Respirations 18   Systolic BP Systolic BP 187   Diastolic BP (mmHg) Diastolic BP (mmHg) 89   Mean BP 121   Pulse Ox % Pulse Ox % 98   Pulse Ox Activity Level  At rest   Oxygen Delivery Room Air/ 21 %  *Intake and Output.:   20-Jul-13 16:16   Stool  Pt had a large amt of loose   Brief Assessment:   Cardiac Regular    Respiratory clear BS    Gastrointestinal details normal Soft  Nontender  Nondistended  No masses palpable  Bowel sounds normal   Lab Results: Hepatic:  18-Jul-13 16:51    Bilirubin, Total  28.4   Alkaline Phosphatase  1769   SGPT (ALT)  151 (12-78 NOTE: NEW REFERENCE RANGE 02/26/2011)   SGOT (AST)  145   Total Protein, Serum  5.0   Albumin, Serum  2.0  19-Jul-13 01:16    Bilirubin, Direct  20.70 (Result(s) reported on 22 Oct 2011 at 09:48AM.)   Bilirubin, Total  26.2   Alkaline Phosphatase  1630   SGPT (ALT)  147 (12-78 NOTE: NEW REFERENCE RANGE 02/26/2011)   SGOT (AST)  133   Total Protein, Serum  4.3   Albumin, Serum  1.9  20-Jul-13 05:26    Bilirubin, Direct  16.90 (Result(s) reported on 23 Oct 2011 at 07:38AM.)   Bilirubin, Total  22.4   Alkaline Phosphatase  1395   SGPT (ALT)  119 (12-78 NOTE: NEW REFERENCE RANGE 02/26/2011)   SGOT (AST)  91   Total Protein, Serum  4.8   Albumin, Serum  1.9  Routine Chem:  20-Jul-13 05:26    Result Comment DIFFERENTIAL - SLIDE PREVIOUSLY REVIEWED BY PATHOLOGIST  Result(s) reported on 23 Oct 2011 at 06:50AM.   Result Comment TBIL - RESULTS VERIFIED BY REPEAT TESTING.  - C/ KATIE CLAYTON @0735 10-23-11.AJO  - READ-BACK PROCESS PERFORMED.  Result(s) reported  on 23 Oct 2011 at 06:51AM.   Glucose, Serum  129   BUN  55   Creatinine (comp)  1.88   Sodium, Serum 145   Potassium, Serum  3.0   Chloride, Serum  115   CO2, Serum  14   Calcium (Total), Serum  7.4   Osmolality (calc) 306   eGFR (African American)  45   eGFR (Non-African American)  39 (eGFR values <60mL/min/1.73 m2 may be an indication of chronic kidney disease (CKD). Calculated eGFR is useful in patients with stable renal function. The eGFR calculation will not be reliable in acutely ill patients when serum creatinine is changing rapidly. It is not useful in  patients on dialysis. The eGFR calculation may not be applicable to patients at the low and high extremes of body sizes, pregnant women, and vegetarians.)   Anion Gap 16  Routine Hem:  20-Jul-13 05:26    WBC (CBC)  21.0   RBC (CBC)  2.80   Hemoglobin (CBC)  8.7   Hematocrit (CBC)  26.5   Platelet Count (CBC) 271   MCV 95   MCH 31.2   MCHC 32.9   RDW  18.9   Segmented Neutrophils 74   Lymphocytes 23     Monocytes 2   Basophil 1   NRBC 27   Diff Comment 1 ANISOCYTOSIS   Diff Comment 2 POIKILOCYTOSIS   Diff Comment 3 TARGET CELLS   Diff Comment 4 PLTS VARIED IN SIZE  Result(s) reported on 23 Oct 2011 at 06:50AM.   Assessment/Plan:  Assessment/Plan:   Assessment 1) high grade biliary obstruction/mass-s/p ercp with placement of biliary stent. some improvement of liver labs overnight.   2) H/O etoh abuse.  Patient presented with elevated ammonia, now normal.  Patient is confabulatory.  Question of possible Wernicke-Korsikoff syndrome versus other cause possible metabolic in setting of renal insufficiency and liver dysfunction.    Plan 1) awaiting pathology 2) serial lfts, pt 3) consider neurology consult.   Electronic Signatures: Skulskie, Martin (MD)  (Signed 20-Jul-13 20:30)  Authored: Chief Complaint, VITAL SIGNS/ANCILLARY NOTES, Brief Assessment, Lab Results, Assessment/Plan   Last Updated: 20-Jul-13 20:30  by Skulskie, Martin (MD) 

## 2014-07-23 NOTE — Consult Note (Signed)
Chief Complaint:   Subjective/Chief Complaint More alert today. Daughters present. Discussed situations with family. Troponin levels rising.   VITAL SIGNS/ANCILLARY NOTES: **Vital Signs.:   22-Aug-13 13:40   Vital Signs Type Routine   Temperature Temperature (F) 99   Celsius 37.2   Temperature Source Oral   Pulse Pulse 79   Respirations Respirations 18   Systolic BP Systolic BP 878   Diastolic BP (mmHg) Diastolic BP (mmHg) 98   Mean BP 123   Pulse Ox % Pulse Ox % 98   Pulse Ox Activity Level  At rest   Oxygen Delivery Room Air/ 21 %   Telemetry pattern Cardiac Rhythm Normal sinus rhythm; pattern reported by Telemetry Clerk; 72   Brief Assessment:   Cardiac Regular    Respiratory normal resp effort    Gastrointestinal Normal   Lab Results: Routine Chem:  22-Aug-13 02:56    Glucose, Serum  176   BUN  32   Creatinine (comp) 1.27   Sodium, Serum  150   Potassium, Serum  3.4   Chloride, Serum  108   CO2, Serum  35   Calcium (Total), Serum  8.1   Anion Gap 7   Osmolality (calc) 309   eGFR (African American) >60   eGFR (Non-African American) >60 (eGFR values <16mL/min/1.73 m2 may be an indication of chronic kidney disease (CKD). Calculated eGFR is useful in patients with stable renal function. The eGFR calculation will not be reliable in acutely ill patients when serum creatinine is changing rapidly. It is not useful in  patients on dialysis. The eGFR calculation may not be applicable to patients at the low and high extremes of body sizes, pregnant women, and vegetarians.)  Routine Hem:  22-Aug-13 02:56    WBC (CBC)  10.9   RBC (CBC)  3.41   Hemoglobin (CBC)  10.9   Hematocrit (CBC)  33.0   Platelet Count (CBC) 232   MCV 97   MCH 31.9   MCHC 32.9   RDW  15.9   Neutrophil % 80.9   Lymphocyte % 9.9   Monocyte % 9.0   Eosinophil % 0.0   Basophil % 0.2   Neutrophil #  8.8   Lymphocyte # 1.1   Monocyte # 1.0   Eosinophil # 0.0   Basophil # 0.0 (Result(s)  reported on 25 Nov 2011 at 03:16AM.)   Radiology Results: XRay:    21-Aug-13 07:51, Chest Portable Single View   Chest Portable Single View    REASON FOR EXAM:    sob  COMMENTS:       PROCEDURE: DXR - DXR PORTABLE CHEST SINGLE VIEW  - Nov 24 2011  7:51AM     RESULT: Comparison: None    Findings:     Single portable AP chest radiograph is provided.  There is no focal   parenchymal opacity, pleural effusion, or pneumothorax. Normal   cardiomediastinal silhouette. The osseous structures are unremarkable.    IMPRESSION:     No acute disease of the chest.    Dictation Site: 1          Verified By: Jennette Banker, M.D., MD  Cardiology:    22-Aug-13 10:06, Echo Doppler   Echo Doppler    Interpretation Summary    Normal chamber size and normal LVEF, but mild diastolic dysfunction.   There is very large left pleural effusion, probably needs   thoracocentesis, and trivial pericardial effusion.    PatientHeight: 173 cm    PatientWeight: 46  kg    BSA: 1.6 m2    Procedure:    A two-dimensional transthoracic echocardiogram with color flow and   Doppler was performed.    Left Ventricle    The left ventricle is grossly normal size.    Left ventricular systolic function is normal.    Ejection Fraction = >55%.    Atria    The left atrial size is normal.    Mitral Valve    There is mild mitral regurgitation.    Tricuspid Valve    There is trace tricuspid regurgitation.    Right ventricular systolic pressure is normal.    Pericardium/Pleural    Small pericardial effusion.    Large left pleural effusion.    Trivial anterior pericardial effusion.    Small posterior pericardial effusion.    MMode 2D Measurements and Calculations    IVSd: 1.3 cm    LVIDd: 3.8 cm    LVIDs: 2.7 cm    LVPWd: 1.0 cm    FS: 29 %    EF(Teich): 56 %    Ao root diam: 2.8 cm    LA dimension: 2.8 cm    LVOT diam: 2.0 cm    Doppler Measurements and Calculations    MV E point: 42 cm/sec     MV A point: 113 cm/sec    MV E/A: 0.37     MV dec time: 0.17 sec    Ao V2 max: 106 cm/sec    Ao max PG: 4.0 mmHg    AVA(V,D): 2.5 cm2    LV max PG: 3.0 mmHg    LV V1 max: 84 cm/sec    PA V2 max: 98 cm/sec    PA max PG: 4.0 mmHg    TR Max vel: 205 cm/sec    TR Max PG: 17 mmHg    RVSP: 22 mmHg    RAP systole: 5.0 mmHg    Reading Physician: Neoma Laming   Sonographer: Sherrie Sport  Interpreting Physician:  Neoma Laming,  electronically signed on   11-25-2011 13:04:44  Requesting Physician: Neoma Laming   Assessment/Plan:  Assessment/Plan:   Assessment Pancreatic cancer. UGI bleeding. No signif damage to the heart.    Plan Large pleural effusion. Needs to be drained 1st. IF cardiology allows, may try to attempt ERCP tomorrow afternoon. Daughters made aware that if mass enlarged since last ERCP and compressed the duodenum, then wil not be able to get scope down far enough to repeat ERCP.   Electronic Signatures: Verdie Shire (MD)  (Signed [phone] 13:50)  Authored: Chief Complaint, VITAL SIGNS/ANCILLARY NOTES, Brief Assessment, Lab Results, Radiology Results, Assessment/Plan   Last Updated: 22-Aug-13 13:50 by Verdie Shire (MD)

## 2014-07-23 NOTE — Consult Note (Signed)
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   Patient was seen by consultant.   Consult note dictated.   Recommend further assessment or treatment.   Orders entered.   Discussed with Attending MD.   Comments: [name] has no past psychiatric history. He is hospitalized for pancreatic cancer. He presented with AMS and was unable to participate in his treatment due to confusion. He is no longer confused. He understands that he has cancer, wants to meet with a "cancer doctor" to discuss his options and desires treatment. He is also agreable [facility]. He does have a sister who lives in [place], [place]. He does ot remembre her phone number but has it at home. He believes that the sister will help him make medical decisions in the future.   MSE: Alert, fully oriented, cooperative, well groomed, good eye contact, pleasant interaction. No SI/HI, no psychotic symptoms. Fair insight and judgment.  PLAN: 1. The patient does have the capacity to make decisions about his treatment and disposition.    2. Please continue remeron for depression.   3. I will sign off.  Electronic Signatures: Kristine LineaPucilowska, Jolanta (MD)  (Signed 25-Jul-13 19:28)  Authored: Brief Consult Note   Last Updated: 25-Jul-13 19:28 by Kristine LineaPucilowska, Jolanta (MD)

## 2014-07-23 NOTE — Consult Note (Signed)
    Comments   Daughter Rebekah Chesterfield(Vatrice) returned my call. She is still in communication with pt and sees him every 3 months or so. She says that communication is difficult however due to patient routinely not answering his phone due to "paranoia" and his self isolation. She last saw her father in May 2013 but has since stopped by his house on her way through town and was unable to get patient to come to the door or answer the phone despite having evidence that he was home. She attributed this incident to pt's paranoia. She denies a history of mental illness but strongly feels he likely has mental problems that have not been diagnosed. She also denies known substance abuse (may have had drinking problems when she was a kid). Daughter says patient is divorced and that pt has two daughters (Vatrice and [place]Victoria) who are next of kin. It is obvious that daughter does not know about the cancer diagnosis although she has had concerns with note of significant weight loss.  consult is pending. It will be important to document pt's capacity to make decisions. It is apparent that he has a fragmented social network, is isolated, and may not have the resources or family/friends available to provide for postoperative care in the home.  20 additional minutes   Electronic Signatures: Giovana Faciane, Daryl EasternJoshua R (NP)  (Signed 15-Aug-13 17:11)  Authored: Palliative Care Phifer, Harriett SineNancy (MD)  (Signed 15-Aug-13 17:32)  Authored: Palliative Care   Last Updated: 15-Aug-13 17:32 by Phifer, Harriett SineNancy (MD)

## 2014-07-23 NOTE — Consult Note (Signed)
PATIENT NAME:  Corey Ortiz, Corey Ortiz MR#:  [id number] DATE OF BIRTH:  03/12/1956  DATE OF CONSULTATION:  11/26/2011  REFERRING PHYSICIAN:  Dr. Candace Cruise CONSULTING PHYSICIAN:  Simonne Come. Gittin, MD  HISTORY OF PRESENT ILLNESS: 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 No metastasis clinically. No metastases or adenopathy on CT of the chest and abdomen. Elevated CA-19-9 and mildly elevated CEA. Problems also include anemia and reactive leukocytosis, enlarged nodular thyroid on CT scan. Status post stenting. Bilirubin was over 20. Bilirubin came down to 3.5. He has prior had 2 units of blood and a dose of Procrit. Patient was initially seen in the office on 07/31. He was seen in [facility] by me earlier. Please note also discharge summary from [facility] on July 29. Notably, patient was being evaluated for possible resectable tumor. He had evaluation with Dr. Lazarus Gowda of surgery here and at [facility]. Patient was hospitalized at [facility] and was to have an endoscopic ultrasound and repeat CT scan with pancreatic protocol but he signed out of [place]. He has been repeatedly advised about the appropriate potential courses of action but he also changed his mind and had confusion at times and lacked insight into his illness and as stated did not follow through with prescribed evaluation. Subsequently, patient with recurrent nausea, vomiting, upper GI bleed. Presents to [facility] on 08/21. He is intermittently confused, although he is recovered and is alert, cooperative at this point in the hospital. Patient has been evaluated by medicine, is on the medicine service. 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 He is followed by Dr. Candace Cruise. Low potassium was treated. Elevated troponin has been evaluated by cardiology. 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 As per prior discussion with Dr. Lazarus Gowda, [facility], patient due to his comorbidities and now with the development of gastric outlet obstruction is considered to have progressed and virtually certain to have more extensive disease and would preclude a primary resection.  PAST MEDICAL HISTORY:  1. Anemia. 2. Renal failure.  3. Leukocytosis, although this appears to be reactive. 4. Nodular thyroid that is most likely goiter potentially is malignant. 5. History of alcohol and tobacco abuse in the past.  6. Kidney stones in the past.   MEDICATIONS AT TIME OF ADMISSION: 1. Remeron 50 mg daily. 2. Omeprazole 20 mg daily.   ALLERGIES: No known allergies.   FAMILY HISTORY: Negative.   PHYSICAL EXAMINATION:  GENERAL: Patient was alert and cooperative. He was not in acute distress and was more alert than he had previously been described at the time of admission.   ABDOMEN: There was mild tenderness in the mid abdomen and epigastrium. No definite organomegaly. No palpable mass.   LUNGS: Clear. No wheezing or rales.   LYMPH NODES: Not palpable in the neck, supraclavicular, submandibular, axilla   EXTREMITIES: No extremity edema. No calf tenderness. No rash or bruising.   HEART: Regular. No thrush.   NEUROLOGIC: Grossly nonfocal. Patient with cognitive impairment, some memory lapses regarding recent events.   LABORATORY, DIAGNOSTIC AND RADIOLOGICAL DATA: On admission creatinine 1.29, potassium 2.9, subsequently replaced. Bilirubin 3.5, white count 9.6, hemoglobin 11.4, platelets 255, ammonia level less than 25. CT of the head showed no acute issues.   CONSULTATIONS: 1. Cardiology consulted.  No definite diagnosis of acute cardiac event.  2. GI has followed and signed off with inability to get past the duodenum to re-evaluate biliary stents.   IMPRESSION AND PLAN: Also met with patient's  family at bedside. Prior discussed with Dr. Lazarus Gowda. Discussed with Dr. Pat Patrick as well. Although patient is a poor candidate for aggressive surgery and it is most likely unresectable disease, he does not have definite imaging to establish the extent of his disease beyond the initial CT that did not show metastatic disease. He needs to the bypassed or palliated. He is a poor candidate for aggressive chemotherapy or radiation but he and the family are still interested in treatment so that palliative radiation at the appropriate time and chemotherapy with either FOLFOX or Gemzar-based therapy would be considered for palliation and for potential prolongation of life. Now he needs to be seen by surgery for consideration of gastric bypass or potentially revisit if he is potentially resectable or should he have other imaging. Dr. Pat Patrick will see the patient to consider whether or not to go forward with gastrojejunostomy.  ____________________________ Simonne Come Inez Pilgrim, MD rgg:cms D: 11/27/2011 17:25:00 ET T: 11/28/2011 10:44:53 ET JOB#: [id number]  cc: Simonne Come. Inez Pilgrim, MD, <Dictator> Dallas Schimke MD ELECTRONICALLY SIGNED 12/11/2011 20:53

## 2014-07-23 NOTE — Consult Note (Signed)
    Comments   I spoke with pt about continued aggressive medical therapy vs comfort care at [facility]. He seemed to understand everything I said and he repeatedly told me "I want to fight". Pt understands that his prognosis is poor but is not yet ready to stop aggressive tx. I encouraged him to work with PT and he says that he will try. spoke with daughter, Rebekah ChesterfieldVatrice, and relayed information to her about my conversation with pt.   Electronic Signatures for Addendum Section:  Borders, Daryl EasternJoshua R (NP) (Signed Addendum 10-Sep-13 16:28)  Received followup call from daughter Rebekah Chesterfield(Vatrice). Updated her. She asks to be called tomorrow at work # [phone]   Electronic Signatures: Alesi Zachery, Harriett SineNancy (MD)  (Signed 10-Sep-13 13:36)  Authored: Palliative Care   Last Updated: 10-Sep-13 16:28 by Malachy MoanBorders, Joshua R (NP)

## 2014-07-23 NOTE — Op Note (Signed)
PATIENT NAME:  Corey Ortiz, NETZEL MR#:  [id number] DATE OF BIRTH:  1955-08-03  DATE OF PROCEDURE:  11/29/2011  OPERATION PERFORMED:  1. Hepaticojejunostomy Roux-en-Y.  2. Loop gastrojejunostomy.  3. Open cholecystectomy.  4. Gastrostomy tube placement (with transgastric jejunostomy feeding tube).   SURGEON: Consuela Mimes, MD   FIRST ASSISTANT: Sherri Rad, MD   ANESTHESIA: General.  PREOPERATIVE DIAGNOSIS: Gastric outlet obstruction and obstructive jaundice from locally aggressive pancreatic cancer.   POSTOPERATIVE DIAGNOSIS: Gastric outlet obstruction and obstructive jaundice from locally aggressive pancreatic cancer.  PROCEDURE IN DETAIL: The patient was placed supine on the operating room table and prepped and draped in the usual sterile fashion. A right subcostal incision was made and carried down through the layers of the abdominal wall and the peritoneum was entered carefully. The falciform ligament was divided and the round ligament was divided with the Harmonic scalpel. The adhesions from the visceral surface of the gallbladder wall to the duodenum were taken down with the electrocautery and the patient's pancreatic cancer was palpated and noted to be very firm and quite large but there were no evidence of metastases either to peripancreatic lymph nodes or the liver or the peritoneal surfaces. The patient's gastric outlet obstruction was extremely severe. The patient's stomach was huge and stretched from the top of the left subphrenic area all the way across to the right side of the abdomen. The stomach wall was extremely thick. Cholecystectomy was undertaken by taking the gallbladder down from the liver bed with the electrocautery dividing the cystic artery with the Harmonic scalpel and dividing the cystic duct and leaving a 2-0 silk ligature in place towards the common bile duct. Peritoneal tissue over top of the markedly dilated common bile duct was excised and the common bile duct  was encircled and the anterior portion of the common bile duct was opened and the stent was searched for but no stent was found. I was able to probe down into the duodenum with a right angle clamp and still could not find the stent. The distal common bile duct was oversewn with a running 2-0 Monocryl suture and the common hepatic duct was at least a centimeter and a half in diameter and likely closer to 2 cm in diameter. There were lots of little stones and sludge within the bile and this was removed and the remaining bile coming down from the liver was clear. A 55 cm long Roux limb was generated from the second or third loop of the jejunum by dividing the jejunum with the GIA stapling device and the mesentery with the Harmonic scalpel and performing an enteroenterostomy on the antimesenteric surface of each 55 cm downstream. The resultant enterotomy was closed with a TA-60 stapling device and a 3-0 silk seromuscular Lembert suture was placed at the apex of the GIA staple line. Three additional 3-0 silk seromuscular Lembert sutures were placed on areas where the staple lines met or there was bleeding that required a touch of the electrocautery. A partial hepatic flexure mobilization was undertaken so that a retrocolic site could be found for a defect in the right colon mesentery and the Roux limb was brought through this in a retrocolic fashion up to the right upper quadrant. An end to side hepaticojejunostomy was performed from the full thickness of the bile duct to the full thickness of the jejunal wall just near the staple line on the antimesenteric surface. This was done after making a slit in the bowel wall. The anastomosis was  performed with running 4-0 PDS suture with knots tied at each apex in a fashion that was both watertight and did not purse-string the anastomosis. A large flat Jackson-Pratt drain was placed underneath the hepaticojejunostomy and brought out through a separate stab incision in the right  flank and secured to the skin there with a 2-0 nylon suture. Attention was turned to the gastric jejunostomy and a suitable location to the patient's left of the middle colic vessels was found and the transverse mesocolon was opened here into the lesser sac. Adhesions from the posterior wall of the stomach to the pancreas were taken down and the patient was noted to have an extremely dilated pancreatic duct being about 2 cm in diameter. This was not harmed or touched in any way. The posterior wall of the stomach just past the incisura of angularis was selected and the first loop of the jejunum was selected for suitable location for the gastrojejunostomy and this was performed with a GIA stapling device on the antimesenteric surface of the small intestine in the posterior wall of the stomach in an isoperistaltic orientation. The resultant enterotomy was closed with a TA-60 stapling device and a 3-0 silk seromuscular Lembert suture was placed at the apex of the GIA staple line and about 4 or 5 interrupted seromuscular Lembert sutures of 2-0 silk were placed to cover the TA staple line area. A suitable location on the anterior wall of the stomach was found for a Moss-type gastrostomy tube that was 36 [place] and made by [facility] rather than Moss. A corresponding location in the left subcostal region was found in the anterior abdominal wall and the gastrostomy tube was placed through a stab incision here and into the anterior wall of the stomach amidst a purse-string suture of 2-0 silk. The jejunal feeding portion of this tube was guided down through the gastrojejunostomy and further through the enteroenterostomy into the efferent limb such that the tube feedings would be given after the enteroenterostomy where the bile would come into the intestinal tract. Balloon was then inflated and drawn back up against the anterior abdominal wall in a flush fashion after three seromuscular Lembert sutures of 2-0 silk were placed on the  anterior stomach wall and to the posterior rectus sheath. The bolster from the gastrostomy tube was positioned onto the skin under minimal tension but good approximation and this bolster was secured to the skin with 2-0 nylon sutures which were further used to secure the bolster to the tube itself and prevent the tube from slipping within the bolster. The abdominal cavity was irrigated with warm normal saline. This was suctioned clear. Hemostasis was excellent. There was no tension on any of the anastomoses and the abdominal wall was then closed with two layers of running #1 PDS suture. The skin was reapproximated with a skin stapling device and a sterile dressing was applied completing the procedure. The patient tolerated the procedure well and there were no complications.   ____________________________ Consuela Mimes, MD wfm:drc D: 11/29/2011 16:54:42 ET T: 11/29/2011 17:35:54 ET JOB#: [id number]  cc: Consuela Mimes, MD, <Dictator> Consuela Mimes MD ELECTRONICALLY SIGNED 12/05/2011 11:19

## 2014-07-23 NOTE — Consult Note (Signed)
Chief Complaint:   Subjective/Chief Complaint Overall feeling better. LFT much improved after biliary stenting. Anemic. 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   VITAL SIGNS/ANCILLARY NOTES: **Vital Signs.:   22-Jul-13 09:15   Vital Signs Type Q 4hr   Temperature Temperature (F) 98.7   Celsius 37   Temperature Source Oral   Pulse Pulse 61   Respirations Respirations 18   Systolic BP Systolic BP 993   Diastolic BP (mmHg) Diastolic BP (mmHg) 88   Mean BP 109   Pulse Ox % Pulse Ox % 99   Pulse Ox Activity Level  At rest   Oxygen Delivery Room Air/ 21 %   Brief Assessment:   Cardiac Regular    Respiratory clear BS    Gastrointestinal nontender today   Lab Results: Hepatic:  22-Jul-13 04:17    Bilirubin, Total  8.3   Alkaline Phosphatase  1000   SGPT (ALT) 77 (12-78 NOTE: NEW REFERENCE RANGE 02/26/2011)   SGOT (AST) 27   Total Protein, Serum  4.5   Albumin, Serum  1.6  Routine Chem:  22-Jul-13 04:17    Result Comment diff - SLIDE PREVIOUSLY REVIEWED BY PATHOLOGIST wbc count - corrected for NRBC count. mpg 10/25/11  Result(s) reported on 25 Oct 2011 at 06:01AM.   Glucose, Serum  120   BUN  27   Creatinine (comp) 1.01   Sodium, Serum 140   Potassium, Serum  3.1   Chloride, Serum  112   CO2, Serum  17   Calcium (Total), Serum  7.8   Osmolality (calc) 286   eGFR (African American) >60   eGFR (Non-African American) >60 (eGFR values <49mL/min/1.73 m2 may be an indication of chronic kidney disease (CKD). Calculated eGFR is useful in patients with stable renal function. The eGFR calculation will not be reliable in acutely ill patients when serum creatinine is changing rapidly. It is not useful in  patients on dialysis. The eGFR calculation may not be applicable to patients at the low and high extremes of body sizes, pregnant women, and vegetarians.)   Anion Gap 11  Routine Hem:  22-Jul-13 04:17    WBC (CBC) 6.7   RBC (CBC)  2.29   Hemoglobin (CBC)  7.4   Hematocrit  (CBC)  22.2   Platelet Count (CBC) 194   MCV 97   MCH 32.2   MCHC 33.3   RDW  19.2   Bands 3   Segmented Neutrophils 79   Lymphocytes 13   Monocytes 2   Eosinophil 3   NRBC 50   Diff Comment 1 ANISOCYTOSIS   Diff Comment 2 POLYCHROMASIA   Diff Comment 3 POIKILOCYTOSIS   Diff Comment 4 TARGET CELLS   Diff Comment 5 PLTS VARIED IN SIZE  Result(s) reported on 25 Oct 2011 at 06:01AM.   Assessment/Plan:  Assessment/Plan:   Assessment Pancreatic mass with jaundice. Jaundice improved with biliary stenting.    Plan Moniter hgb. Transfuse as needed. 7Fr biliary stent will not last long. Recommend exchanging with a metal stent later.Discussed with patient. Due to pancreatic/ampullary mass, may be difficult to get scope down to the duodenum next time to place metal stent.   Electronic Signatures: Verdie Shire (MD)  (Signed 22-Jul-13 12:42)  Authored: Chief Complaint, VITAL SIGNS/ANCILLARY NOTES, Brief Assessment, Lab Results, Assessment/Plan   Last Updated: 22-Jul-13 12:42 by Verdie Shire (MD)

## 2014-07-23 NOTE — Consult Note (Signed)
Brief Consult Note: Diagnosis: cancer pancreas.   Patient was seen by consultant.   Consult note dictated.   Comments: see dictated note  patient seen 8/23 at request of Dr Candace Cruise. Also met with family. As per prior discussion with [facility], progression to duoenal obstruction and other comorbidities indicates that patient is not a candidate for resection. Family does not want palliative only or hospice care. Discused with family and later Dr Pat Patrick, re gastrojejunostomy, Dr Pat Patrick to see, to consider palliative surgery or to reconsider primary resection, or additional imaging. Patient is a poor candidate for agressive chemotx with high risk morbidity but he and family want to consider palliative xrt at appropriate time and are currently interested in trying chemotx to palliate symptoms and possibl;y prolong life. Currently needs IV support pending surgical evaluation.  Electronic Signatures: Dallas Schimke (MD)  (Signed 24-Aug-13 17:12)  Authored: Brief Consult Note   Last Updated: 24-Aug-13 17:12 by Dallas Schimke (MD)

## 2014-07-23 NOTE — Consult Note (Signed)
Brief Consult Note: Diagnosis: Altered mental status.   Patient was seen by consultant.   Consult note dictated.   Comments: Appreciate consult for 59 y/o [place] man with recent diagnosis of pancreatic cancer, admitted with AMS, for evaluation of GI bleeding and pancreatic cancer. The patient is not speaking at present, and there is no family available. Evidentally he has been agitated and somewhat comabtive today, and has been sedated with morphine and ativan. Details are gleaned from nursing staff and  chart. Patient has been diagnosed with pancreatic cancer last month. Did undergo ERCP 7/19, which revealed quite a large tumor. A stent was placed. He has been following with Dr Lorre NickGittin, and evidentally has seen Dr Madaline BrilliantMosca as well. Today he went to [facility] to have repeat ERCP for unknown reason, but was hypotensive, so he was sent home. He became confused, so family brought him to ED here, and he was admitted with elevated troponins, and some EKG changes in the anterolateral leads. He has also had some nausea and coffee ground emesis. None at present. Although the patient opens his eyes spontaneoulsy and maew x 4, he will not answer questions, so I am unable to assess abdominal pain. His abdomen is flat, soft with hypoactive bowel sounds x 4. It is not distended, and there is no evident tenderness: ie, no flinching, grimacing, guarding, etc.  Impression and plan:  History of coffee ground emesis, pancreatic cancer. Concerning for GI bleeding: sources include gastric body, esophagus, and the pancreatic tumor itself. Due to elevated troponins and EKG changes, will need cardiac evaluation prior to any procedures. Do recommend bid IV PPI, serial hemoglobins, transfusing prn. As patient has been uncooperative, a barium study would be difficult at present.  Further recommendations to follow.  Should consider involving Dr Lorre NickGittin due to pancreatic cancer. Would also recommend correcting electrolyte  imbalances.  Electronic Signatures: Vevelyn PatLondon, Markeisha Mancias H (NP)  (Signed 21-Aug-13 16:49)  Authored: Brief Consult Note   Last Updated: 21-Aug-13 16:49 by Keturah BarreLondon, Gurpreet Mikhail H (NP)

## 2014-07-23 NOTE — Consult Note (Signed)
PATIENT NAME:  Corey Ortiz, Corey Ortiz MR#:  [id number] DATE OF BIRTH:  12-09-1955  DATE OF CONSULTATION:  11/18/2011  REFERRING PHYSICIAN:   CONSULTING PHYSICIAN:  Audery Amel, MD  IDENTIFYING INFORMATION AND REASON FOR CONSULT: A 59 year old man currently in the hospital for treatment of outlet obstruction from pancreatic cancer. Consult for evaluation of agitation and his ability to make reasonable treatment decisions.   HISTORY OF PRESENT ILLNESS: Information was obtained from the patient, from the chart, from speaking with the palliative care provider, and briefly speaking with the patient's neighbors. The patient was in the hospital until yesterday being treated for his outlet obstruction, which was probably due to his pancreatic cancer. Abruptly yesterday he got up and insisted on leaving the hospital and insisted on eating or drinking despite being told that he had to stay n.p.o. Evidently he went home and had some water to drink or something to eat and immediately had the predictable severe pain and came back to the hospital. He has been described as having "flight of ideas." And there has been concern about his confusion.   When I came to see the patient today he was asleep, and it took some time to arouse him. He was very slow to arouse, and when he did wake up he is gripped intermittently by what appears to be severe pain which obviously interrupts his ability to communicate. At first he was incoherent but after several minutes he was able to wake up and have a reasonable conversation. He was not oriented to the exact hospital he was in and kept saying he was at [facility] but at least he knows he is in the hospital. He was not able to tell me the year or the date. I asked him if he could tell me what his diagnosis was, and he was not able to do that at first. The best he could do was to say that somebody told him that there was something wrong, and he needed some treatment. Eventually  he did say that there was cancer and then was able to describe the events yesterday. He told me that the doctors did tell him that he could not have anything to eat but he did not understand it yesterday. He tells me now that he understands that he definitely does not want to try and repeat that experience.   He has had previous hospitalizations within the last year, especially earlier this summer. At that time there was also concern about his confusion. Dr. Jennet Maduro saw him at that time and thought that there was some evidence of delirium and possibly also depression. Interviewing him today the patient does not endorse symptoms of depression. His chief concern and complaint is about the pain in his abdomen and wanting to get appropriate treatment for it. He denies any suicidal thoughts. Denies any hallucinations.   PAST PSYCHIATRIC HISTORY: He denies ever having had any kind of mental health or psychiatric treatment ever before in his life, which is what has been reported on previous histories as well. Patient describes himself as having had a full career in the [employer] before he retired from [employer]. 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 Since living in [place] for the last 15 to 20 years he apparently has mostly kept to himself. His neighbors describe him as being a very private man who does not socialize much but say that he keeps a very neat house and he has never been disruptive and  there never been any sign that there was anything wrong with him.   MEDICAL HISTORY: Patient is not aware of having any prior medical problems before the progressive problems in the last several months which have been diagnosed as pancreatic cancer.   SOCIAL HISTORY: The patient lives alone. He says he has been married in the past. The neighbors tell me that they think they have heard that his ex-wife is a retired Education administratorjudge. He does have children; at least one daughter has been identified. He does not  have any relationships or close friends currently. He stays mostly to himself. He says that financially he has been okay because of his retirement pay.   REVIEW OF SYSTEMS: Chief complaint is definitely his pain, nausea, and discomfort in his abdomen. He does not complain of any psychiatric symptoms.   MENTAL STATUS EXAMINATION: Sick-appearing, cachectic man in a hospital bed. He was asleep when I found him but he woke up when I spoke to him and touched gently. It took him several minutes to come to full consciousness. During the time that he was waking up he made several nonsense statements and answered questions inappropriately. Once he seemed to have woken up he was still wracked by pain intermittently during which time he cannot communicate. However, when he was not having a spasm of pain, he was able to make appropriate eye contact. Had normal psychomotor activity. His speech became normal in tone and rate. His affect in between the pain episodes was reactive and appropriate to the situation. His mood was stated as being worried and frightened about what was happening but at the same time feeling like if it was his time that that would be okay. He clarifies, however, that this no way means that he is suicidal. He says that he definitely wants to get appropriate treatment if it can decrease his discomfort or extend his life. The patient does not report any hallucinations. Denies suicidal or homicidal ideation. He was able to identify that he was in a hospital but [facility]. He was not able to tell me the date or year. After several minutes, he was able to tell me that he was sick with cancer. He was not able to describe to me exactly what cancer was but said that he knew that it was something that could make people die. When I explained it to him he was able to give me a fairly coherent repeat of what I had said. He was able to understand that there may be treatment for him that could  extend his life or decrease his discomfort, and he stated very clearly that he wanted to have that done if the experts thought it was the thing to do. He did not show any acute signs of major depression or psychosis.   CURRENT VITAL SIGNS: Temperature 98, pulse 80, most recent blood pressure 174/94.   LABORATORY RESULTS: Lots of lab results but most immediately his chemistry shows an elevated glucose at 122, sodium 146, potassium low at 2.7, bilirubin up quite a bit at 4.4. Alkaline phosphatase elevated at 282, albumin low at 2.5. Lipase low at 28. Magnesium elevated at 2.5. Urinalysis does not appear to show an infection. CBC is normal.   ASSESSMENT: This is a 59 year old man with no known past psychiatric history who has what has apparently been diagnosed as pancreatic cancer with damage to liver function. Probably has a bowel obstruction from it now and is in acute pain.  He has been intermittently confused at times, and there has been concern about his ability to understand treatment. On my exam today the patient shows waxing and waning mental state. At times he was confused and unable to answer any questions coherently; however, when given time to slowly wake up and pay attention he was able to engage in a more reasonable conversation in which he did understand what we were talking about and gave appropriate answers about treatment. At this time I think that what can be expected is that his mental state may change from moment to moment because of his medical condition. At the time that I was talking to him he was able to give reasonable answers and make appropriate judgments about his medical care; however, it very possible that there may be times that he will not be able to do that. I am going to recommend that the family be involved to assist because it is very possible that he will have a time soon that he will not be able to make appropriate medical decisions.   TREATMENT PLAN: I am going to put in  an order for some p.r.n. IV Haldol, although at the moment I do not think he needs to be given any. This will be in case he becomes agitated and confused. I strongly recommend getting the family involved as it is very likely that they may be required to make some medical decisions. At this moment now he is able to make reasonable decisions about his medical care and give informed consent. I strongly recommend that when people talk with him about proposed treatments they be patient, talk slowly, give him time to wake up and to recover from some of his acute pain and that things be explained very clearly to him before making any medical decisions. I do not think there is any need for any antidepressants or any other psychiatric treatment at this point. If he is still here tomorrow, I will follow up but it is my understanding that he is likely to be transferred to [facility]. I told him that, by the way, and he was in complete agreement with it saying that if the doctors thought that that would be the best thing for his treatment he was willing to do it.   DIAGNOSIS PRINCIPLE AND PRIMARY:  AXIS I: Delirium secondary to multiple medical conditions.   SECONDARY DIAGNOSES:  AXIS I: No further.  AXIS II: No diagnosis.  AXIS III: Pancreatic cancer.  AXIS IV: Severe from social isolation.  AXIS V: Functioning at time of evaluation 35.       ____________________________ Audery Amel, MD jtc:vtd D: 11/18/2011 18:21:10 ET T: 11/19/2011 09:02:16 ET JOB#: [id number]  cc: Audery Amel, MD, <Dictator> Audery Amel MD ELECTRONICALLY SIGNED 11/19/2011 16:28

## 2014-07-23 NOTE — Consult Note (Signed)
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   Patient was seen by consultant.   Consult note dictated.   Recommend further assessment or treatment.   Orders entered.   Discussed with Attending MD.   Comments: [name] has no past psychiatric history. He is hospitalized for pancreatic cancer.   PLAN: 1. The patient does not have the capacity to make medical decisions. He does not realize what his medical problems are. There is a sister in [place] and kids in [place].   2. I will start remeron for depression.   3. I will follow up.  Electronic Signatures: Kristine LineaPucilowska, Juanda Luba (MD)  (Signed 22-Jul-13 14:24)  Authored: Brief Consult Note   Last Updated: 22-Jul-13 14:24 by Kristine LineaPucilowska, Meeyah Ovitt (MD)

## 2014-07-23 NOTE — Consult Note (Signed)
Chief Complaint:   Subjective/Chief Complaint Overall ok. Pain decreasing. LFT improving. Prelim path c/w adenoca. Pt informed of results.   VITAL SIGNS/ANCILLARY NOTES: **Vital Signs.:   24-Jul-13 14:46   Vital Signs Type Routine   Temperature Temperature (F) 100.5   Celsius 38   Temperature Source oral   Pulse Pulse 67   Respirations Respirations 18   Systolic BP Systolic BP 068   Diastolic BP (mmHg) Diastolic BP (mmHg) 64   Mean BP 78   Pulse Ox % Pulse Ox % 98   Pulse Ox Activity Level  At rest   Oxygen Delivery Room Air/ 21 %   Brief Assessment:   Cardiac Regular    Respiratory clear BS    Gastrointestinal Normal   Lab Results: Hepatic:  23-Jul-13 04:09    Bilirubin, Total  7.8   Alkaline Phosphatase  935   SGPT (ALT) 74 (12-78 NOTE: NEW REFERENCE RANGE 02/26/2011)   SGOT (AST) 34   Total Protein, Serum  4.8   Albumin, Serum  1.7  Routine Chem:  23-Jul-13 04:09    Glucose, Serum 84   BUN  22   Creatinine (comp) 1.04   Sodium, Serum 140   Potassium, Serum  3.4   Chloride, Serum  112   CO2, Serum  18   Calcium (Total), Serum  7.8   Osmolality (calc) 282   eGFR (African American) >60   eGFR (Non-African American) >60 (eGFR values <67m/min/1.73 m2 may be an indication of chronic kidney disease (CKD). Calculated eGFR is useful in patients with stable renal function. The eGFR calculation will not be reliable in acutely ill patients when serum creatinine is changing rapidly. It is not useful in  patients on dialysis. The eGFR calculation may not be applicable to patients at the low and high extremes of body sizes, pregnant women, and vegetarians.)   Anion Gap 10   Assessment/Plan:  Assessment/Plan:   Assessment Prob pancreatic cancer with jaundice.    Plan Will arrange repeat ERCP with metal stent placement in the next few weeks.Will sign off. Thanks.   Electronic Signatures: OVerdie Shire(MD)  (Signed 24-Jul-13 15:18)  Authored: Chief Complaint, VITAL  SIGNS/ANCILLARY NOTES, Brief Assessment, Lab Results, Assessment/Plan   Last Updated: 24-Jul-13 15:18 by OVerdie Shire(MD)

## 2014-07-23 NOTE — Consult Note (Signed)
Pt intubated for attempted ERCP. Pt has complete duodenal obstruction just beyond the duodenal bulb. Unable to pass either gastroscope or duodenoscope despite balloon dilation to 18mm. Tight stricture, likely from pancreatic/duodenal mass, was relatively short on fluoro. Initially able to get guidewire and balloon through the stricture. Unfortunately, unable to place metal duodenal stent. Since I could not get to ampulla, biliary stent could not be exchanged. Need to keep patient NPO since there were several liters of fluid/blood in stomach. Pt does have esophagitis. Pt and family need to discuss how much more can be done, such surgical jejunostomy and external biliary stenting, etc. At this point, treatments are palliative only. Discussed results with patient and daughter. Recommend patient and family discuss with oncology also. Will sign off. Thanks.     Electronic Signatures: Lutricia Feilh, Roselyne Stalnaker (MD) (Signed on 24-Aug-13 08:43)  Authored   Last Updated: 24-Aug-13 08:44 by Lutricia Feilh, Shonica Weier (MD)

## 2014-07-23 NOTE — Consult Note (Signed)
PATIENT NAME:  Corey Ortiz, Corey Ortiz MR#:  [id number] DATE OF BIRTH:  1955-05-22  DATE OF CONSULTATION:  11/27/2011  REFERRING PHYSICIAN:   CONSULTING PHYSICIAN:  Quentin Orealph Ortiz. Ely III, MD  PRIMARY CARE PHYSICIAN: None ADMITTING PHYSICIAN: [facility]    BRIEF COMPLAINT: Abdominal pain, nausea, vomiting.   HISTORY OF PRESENT ILLNESS: Patient is a pleasant 59 year old gentleman admitted to the hospital on the 21st with dehydration, nausea, and vomiting. The patient has a history of recently diagnosed pancreatic cancer. Diagnosis was made here at [facility] and a biliary stent was placed. The patient was set up for evaluation at [facility] with the plan of pursuing an endoscopic ultrasound and possible surgical evaluation. The patient presented back to the hospital here at [facility] with confusion, dehydration. He was transferred to [facility] for those procedures but signed out AGAINST MEDICAL ADVICE. He has not had a complete evaluation by the GI oncology service or the surgical oncologist at [facility]. His imaging studies allegedly did not demonstrate any evidence of unresectability but the extent of his symptoms suggest significantly advanced pancreatic cancer. He was readmitted to the hospital at [facility] on the 21st with progressive nausea, vomiting, inability to keep anything on his stomach. He was markedly dehydrated, had marked change in his mental status. CT work-up of his head was unremarkable. His laboratory values suggested significant dehydration. He was seen by palliative care, rehydrated and his mental status improved dramatically. He was taken for a duodenal stent yesterday to see if his gastric outlet obstruction could be resolved. The scope could not be passed through the duodenum nor could the pediatric scope be passed. A wire was passed but a stent could not be placed. The surgical service is now consulted to consider possible palliative surgery with bypass.   Patient has  history of alcohol and tobacco abuse in addition to mild renal insufficiency and significant malnutrition, he has lost a significant amount of weight over the last several months.   ALLERGIES: He has no known medical allergies.   MEDICATIONS: His only medications are Remeron and omeprazole.   REVIEW OF SYSTEMS: Otherwise unremarkable other than the weight loss. He denies any significant abdominal pain at the present time.   FAMILY HISTORY: Noncontributory.   PHYSICAL EXAMINATION:  GENERAL: He is an alert, pleasant, comfortable gentleman with no complaints of pain, cooperative and involved in the interview and discussion.   VITAL SIGNS: Blood pressure 162/78, heart rate 68 and regular.    HEENT: Exam unremarkable. No scleral icterus. He appears to have equally round pupils with no facial deformities.   NECK: Supple without adenopathy. His trachea is midline.   CHEST: Clear with no adventitious sounds, although he has very distant breath sounds. He seems to have normal pulmonary excursion.   CARDIAC: No murmurs or gallops. He seems to be in normal sinus rhythm.   ABDOMEN: His abdomen is soft, nontender with no palpable mass. He has no abdominal tenderness. No rebound. No guarding. He has active bowel sounds.   LOWER EXTREMITIES: Lower extremity exam revealed some muscular wasting. Full range of motion. No deformities.   IMPRESSION: This gentleman appears to have advanced pancreatic cancer. The surgical service was consulted for consideration of palliative surgical bypass. I have spoken with Dr. Lorre NickGittin who requested the consultation. I have also spoken with his daughter and ex-wife. At the present time we would recommend asking our groups pancreatic surgeon for an evaluation. Dr. Anda KraftMarterre will see the patient on Monday and I will  contact him personally regarding the patient. He also has an interest in palliative care, should be able to assist [place] in the most appropriate surgical intervention  for this gentleman in a possible end of life situation.       This plan has been outlined to the family and the patient and they are in agreement.   ____________________________ Carmie End, MD rle:cms D: 11/27/2011 18:21:14 ET T: 11/28/2011 10:57:21 ET JOB#: [id number]  cc: Carmie End, MD, <Dictator> Knute Neu. Lorre Nick, MD Quentin Ore MD ELECTRONICALLY SIGNED 11/28/2011 18:20

## 2014-07-23 NOTE — Consult Note (Signed)
Details:    - Psychiatry: Follow up.Today the staff taking care of him noticed more than one instance that indicated he has moments of delirium. On one occasion he said he saw a snake in his room and at other times seemed confused. They describe waxing and waning presentation. When I saw him he was alert and smiling and denied any of these symptoms. He was alert and oriented and affect was pleasant. Able to have straitforward conversation. Denied any hallucinations  I suspect he is getting intermittantly delirius, which could be from a variety of factors related to his illness and hospitalization. He either isn't aware of it or is refusing to admit it. The later option seems possible to me because of how he still seems a little odd and secretive.   I will order a low dose of risperdal at bedtime and then as a prn for confusion and delirium during the day. If the episodes are getting more frequent or disruptive we might want to change to daytime standing doses.   Electronic Signatures: Audery Amellapacs, Lenee Franze T (MD)  (Signed 30-Aug-13 17:16)  Authored: Details   Last Updated: 30-Aug-13 17:16 by Audery Amellapacs, Helen Cuff T (MD)

## 2014-07-23 NOTE — Consult Note (Signed)
Chief Complaint:   Subjective/Chief Complaint stable, mild epigastric discomfort, no n, tolerating po.   VITAL SIGNS/ANCILLARY NOTES: **Vital Signs.:   21-Jul-13 13:57   Vital Signs Type Routine   Temperature Temperature (F) 98.4   Celsius 36.8   Temperature Source Oral   Pulse Pulse 60   Respirations Respirations 18   Systolic BP Systolic BP 148   Diastolic BP (mmHg) Diastolic BP (mmHg) 88   Mean BP 108   Pulse Ox % Pulse Ox % 98   Pulse Ox Activity Level  At rest   Oxygen Delivery Room Air/ 21 %   Brief Assessment:   Cardiac Regular    Respiratory clear BS    Gastrointestinal details normal Soft  No masses palpable  Bowel sounds normal  No rebound tenderness  No gaurding  mild epigastric tenderness   Lab Results:  Hepatic:  21-Jul-13 04:18    Bilirubin, Total  11.0   Alkaline Phosphatase  1222   SGPT (ALT)  94 (12-78 NOTE: NEW REFERENCE RANGE 02/26/2011)   SGOT (AST)  54   Total Protein, Serum  4.5   Albumin, Serum  1.7  Routine Chem:  21-Jul-13 04:18    Result Comment cbc - SLIDE PREVIOUSLY REVIEWED BY PATHOLOGIST  - rw  Result(s) reported on 24 Oct 2011 at 10:27AM.   Glucose, Serum  159   BUN  35   Creatinine (comp) 1.24   Sodium, Serum 141   Potassium, Serum  3.2   Chloride, Serum  114   CO2, Serum  13   Calcium (Total), Serum  7.4   Osmolality (calc) 293   eGFR (African American) >60   eGFR (Non-African American) >60 (eGFR values <60mL/min/1.73 m2 may be an indication of chronic kidney disease (CKD). Calculated eGFR is useful in patients with stable renal function. The eGFR calculation will not be reliable in acutely ill patients when serum creatinine is changing rapidly. It is not useful in  patients on dialysis. The eGFR calculation may not be applicable to patients at the low and high extremes of body sizes, pregnant women, and vegetarians.)   Anion Gap 14  Routine Hem:  21-Jul-13 04:18    WBC (CBC)  14.3   RBC (CBC)  2.56   Hemoglobin (CBC)   7.8   Hematocrit (CBC)  24.0   Platelet Count (CBC) 231   MCV 94   MCH 30.6   MCHC 32.5   RDW  19.2   Segmented Neutrophils 90   Lymphocytes 5   Monocytes 3   Eosinophil 2   NRBC 51   Diff Comment 1 ANISOCYTOSIS   Diff Comment 2 POIKILOCYTOSIS   Diff Comment 3 POLYCHROMASIA   Diff Comment 4 HYPOCHROMIA   Diff Comment 5 MICROCYTES PRESENT   Diff Comment 6 MACROCYTES PRESENT   Diff Comment 7 TARGET CELLS   Diff Comment 8 PLTS VARIED IN SIZE  Result(s) reported on 24 Oct 2011 at 10:27AM.   Assessment/Plan:  Assessment/Plan:   Assessment 1)  pancreatic mass with biliary obstruction.  s/p ercp/biliary stent placement. lfts continue to improve.  biopsies pending.  2) AMS-continues to be confabulatory-?Wernicke-Korsakoff    Plan 1) continue current, may be a candidate for metal stent, will need plastic stent exchange in 3 months.  will d/w Dr Oh tomorrow. 2) consider neuro consult.   Electronic Signatures: Skulskie, Martin (MD)  (Signed 21-Jul-13 18:16)  Authored: Chief Complaint, VITAL SIGNS/ANCILLARY NOTES, Brief Assessment, Lab Results, Assessment/Plan   Last Updated: 21-Jul-13 18:16 by Skulskie,   Hassell Done (MD)

## 2014-07-23 NOTE — Consult Note (Signed)
Chief Complaint:   Subjective/Chief Complaint No acute complaints, denied pain no sob   VITAL SIGNS/ANCILLARY NOTES: **Vital Signs.:   30-Aug-13 14:30   Vital Signs Type Routine   Temperature Temperature (F) 98.7   Celsius 37   Temperature Source Oral   Pulse Pulse 83   Respirations Respirations 18   Systolic BP Systolic BP 136   Diastolic BP (mmHg) Diastolic BP (mmHg) 93   Mean BP 107   Pulse Ox % Pulse Ox % 99   Pulse Ox Activity Level  At rest   Oxygen Delivery Room Air/ 21 %   Brief Assessment:   Cardiac Regular    Respiratory no use of accessory muscles    Additional Physical Exam alert and cooperative, mood positive, no edema, neuro grossly non focal   Lab Results: Routine Chem:  30-Aug-13 16:27    Ammonia, Plasma 25 (Result(s) reported on 03 Dec 2011 at 05:27PM.)  Routine Hem:  30-Aug-13 03:02    Hemoglobin (CBC)  8.2 (Result(s) reported on 03 Dec 2011 at 03:54AM.)   Assessment/Plan:  Assessment/Plan:   Assessment PANCREATIC CANCER. ON PET AND AT SURGERY NO METASTATIC DISEASE. LARGE LOCALIZED TUMOR WITH DUODENAL OBSTRUCTION, AND BILLIARY  OBSTRUCTION, S/P SURGICAL PALLIATION. FOLLOWED BY SURGERY, MEDICINE, PSYCHIATRY. ONCOLOGY PLANS ARE FOLLOWING SURGICAL RECOVERY, NEW BASELINE SCANS AND POTENTIALY EUS, THEN CHEMOTX AND XRT. UNLIKELY BUT POSSIBLE HE COULD STILL HAVE LOCALIZED DISEASE AMENABLE TO ATTEMPTED SURGICAL RESECTION. LATER, WOULD ALSO BE A CANDIDATE FOR SALVAGE , PALLIATIVE, POTENTIALLY LIFE PROLONGING TX IF/WHEN DEVELOPS ADVANCED DISEASE. GI BLEED OF CONCERN . WILL CONSULT RADIATION ONCOLOGY 9/3. DISCUSSED WITH PATIENT TODAY, DID NOT SEE FAMILY TODAY.  NOTHING ACUTE FROM HEME ONC AT THIS TIME. I WILL F/U 9/4, PLEASE RECONSULT DR [facility]ella [facility]INNEGAN [facility] FOR ANY ACUTE HEME/ONC ISSUES   Electronic Signatures: Marin RobertsGittin, Robert G (MD)  (Signed 30-Aug-13 23:36)  Authored: Chief Complaint, VITAL SIGNS/ANCILLARY NOTES, Brief Assessment, Lab Results,  Assessment/Plan   Last Updated: 30-Aug-13 23:36 by Marin RobertsGittin, Robert G (MD)

## 2014-07-23 NOTE — Consult Note (Signed)
PATIENT NAME:  Corey Ortiz, [name] MR#:  [id number] DATE OF BIRTH:  23-Jul-1955  DATE OF CONSULTATION:  10/25/2011  REFERRING PHYSICIAN: Hilda LiasVivek Sainani, MD  CONSULTING PHYSICIAN:  Jolanta B. Pucilowska, MD  REASON FOR CONSULTATION: To evaluate patient with altered mental status.   IDENTIFYING DATA: [name] is a 59 year old male with no past psychiatric history per his own report.   CHIEF COMPLAINT: The patient unable to state.  HISTORY OF PRESENT ILLNESS: [name] was brought to the hospital for altered mental status. We now know that he has a pancreatic tumor and was stented last week. His mental status did not improve much even though his ammonia is now within normal limits. The patient is unable to have a discussion about his illness. He seems not to know why he is in the hospital and what his problems are. He cannot name his diagnosis. He is agreeable to any treatment we would offer and has no opinion of his own. He agrees with me that he could be a little depressed and does not mind to be treated, but any conversation with this patient is very difficult and he is unreliable as a historian.   PAST PSYCHIATRIC HISTORY: He denies prior psychiatric treatment. He denies alcohol or illicit substance use.   FAMILY PSYCHIATRIC HISTORY: Unknown.   PAST MEDICAL HISTORY:  1. Pancreatic mass with jaundice status post biliary stenting.  2. Severe anemia. 3. Possible history of alcohol abuse.  4. Ongoing tobacco abuse.   ALLERGIES: No known drug allergies.   MEDICATIONS ON ADMISSION: None.   SOCIAL HISTORY: He tells me that he lives by himself. He has a sister who lives in [place] and a brother-in-law there. His children are in [place]. He tells me that he has been driving and taking care of himself up until recently.   REVIEW OF SYSTEMS: Unable to obtain. The patient denies any pain or discomfort.   PHYSICAL EXAMINATION:  VITAL SIGNS: Blood pressure 127/77, pulse 73, respirations 20,  temperature 98.7.   GENERAL: This is a cachectic male in no acute distress. The rest of the physical examination is deferred to his primary attending.   LABORATORY, RADIOLOGICAL AND DIAGNOSTIC DATA: Chemistries: Blood glucose 120, BUN 27, creatinine 1.01, sodium 140, potassium 3.1. LFTs: Total protein 4.5, albumin 1.6, total bilirubin 8.3, down from 14, alkaline phosphatase over 1,000, AST 27, ALT 77. Urine tox screen was negative on admission. CBC: Severe anemia with hemoglobin 7.4. Urinalysis is not suggestive of urinary tract infection.   MENTAL STATUS EXAMINATION: The patient is alert. He is oriented to person, place, and time, knows the date from the board. He is clueless about his situation. He is pleasant, polite, and cooperative, but rather confused about his circumstances. He is adequately groomed. He maintains good eye contact. His speech is very soft. There is poverty of speech. His mood is depressed with flat affect. Thought processing is slow. Thought content: He denies thoughts of hurting himself or others. He denies delusions or paranoia or auditory or visual hallucinations. His cognition is impaired, but difficult to assess formally. His insight and judgment are nonexistent.   SUICIDE RISK ASSESSMENT: This is a patient with no past psychiatric history, possibly alcoholism, who is in the hospital with altered mental status and pancreatic tumor. He is in no condition to plan or execute a suicide attempt. He requires support and supervision.   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  2. Delirium secondary to medical condition.  AXIS II: Deferred.   AXIS III:  1. Pancreatic tumor with jaundice status post stent placement.  2. Severe anemia.   AXIS IV: Physical illness, primary support.   AXIS V: GAF 25.   PLAN:  1. The patient does not have the capacity to make medical decisions or decision about disposition. There is a sister in [place]. Maybe we  could contact her. His kids are in [place]. 2. Depression, oftentimes accompanies pancreatic cancer. We will offer Remeron 15 mg at night.  3. I will follow-up.  ____________________________ Ellin Goodie. Jennet Maduro, MD jbp:ap D: 10/25/2011 14:41:47 ET T: 10/25/2011 15:12:27 ET JOB#: [id number]  cc: Jolanta B. Jennet Maduro, MD, <Dictator> Shari Prows MD ELECTRONICALLY SIGNED 10/26/2011 4:43

## 2014-07-23 NOTE — Consult Note (Signed)
Brief Consult Note: Diagnosis: depression nos.   Patient was seen by consultant.   Consult note dictated.   Recommend further assessment or treatment.   Comments: Psychiatry: Patient seen. Chart reviewed. Patient known to me from consult on prior admission. Patient currently denies any symptoms of depression. Has a somewhat odd presentation that I can't quite figure out. Tried to reach his family for collateral info but couldn't reach them. Team already put him on zoloft 50mg   per feeding tube. Although [facility] there is no indication for it he seems to change presentation at different times. No need to stop it. Will follow in hospital.  Electronic Signatures: Audery Amellapacs, Wilberto Console T (MD)  (Signed 29-Aug-13 17:05)  Authored: Brief Consult Note   Last Updated: 29-Aug-13 17:05 by Audery Amellapacs, Kinsly Hild T (MD)

## 2014-07-23 NOTE — Consult Note (Signed)
Chief Complaint:   Subjective/Chief Complaint No acute complaints, says he has not needed pain medication. oob today, unsure how long his arm has been swollen but says better than yesterday   VITAL SIGNS/ANCILLARY NOTES: **Vital Signs.:   05-Sep-13 13:26   Vital Signs Type Routine   Temperature Temperature (F) 98.6   Celsius 37   Temperature Source Oral   Pulse Pulse 111   Respirations Respirations 18   Systolic BP Systolic BP 759   Diastolic BP (mmHg) Diastolic BP (mmHg) 86   Mean BP 97   Pulse Ox % Pulse Ox % 100   Pulse Ox Activity Level  At rest   Oxygen Delivery Room Air/ 21 %   Brief Assessment:   Respiratory normal resp effort    Gastrointestinal details normal Nontender    Additional Physical Exam alert and cooperative, edema left arm, not tender , not warm   Lab Results: Routine Chem:  05-Sep-13 03:39    Result Comment LABS - This specimen was collected through an   - indwelling catheter or arterial line.  - A minimum of 19mls of blood was wasted prior    - to collecting the sample.  Interpret  - results with caution.  Result(s) reported on 09 Dec 2011 at 04:05AM.   Glucose, Serum  174   BUN 17   Creatinine (comp) 0.70   Sodium, Serum 144   Potassium, Serum 3.7   Chloride, Serum  114   CO2, Serum 23   Calcium (Total), Serum  7.2   Anion Gap 7   Osmolality (calc) 293   eGFR (African American) >60   eGFR (Non-African American) >60 (eGFR values <44mL/min/1.73 m2 may be an indication of chronic kidney disease (CKD). Calculated eGFR is useful in patients with stable renal function. The eGFR calculation will not be reliable in acutely ill patients when serum creatinine is changing rapidly. It is not useful in  patients on dialysis. The eGFR calculation may not be applicable to patients at the low and high extremes of body sizes, pregnant women, and vegetarians.)  Routine Hem:  05-Sep-13 03:39    WBC (CBC)  14.9   RBC (CBC)  2.96   Hemoglobin (CBC)  8.9    Hematocrit (CBC)  26.8   Platelet Count (CBC) 175   MCV 91   MCH 30.0   MCHC 33.1   RDW  17.2   Neutrophil % 87.2   Lymphocyte % 7.6   Monocyte % 4.8   Eosinophil % 0.3   Basophil % 0.1   Neutrophil #  13.0   Lymphocyte # 1.1   Monocyte # 0.7   Eosinophil # 0.0   Basophil # 0.0   Assessment/Plan:  Assessment/Plan:   Assessment PANCREATIC CANCER S/P BYPASS FOR DUODENAL OBSTRUCTION. PLANS FOR RADIATION ONCOLOGY CONSULT AND CHEMOTX RADIATION, AFTER NEW BASELINE SCANS, APPROX 3 WEEKS POST OP IF CONTINUES TO IMPROVE. S/P GI BLEED AND MULTIPLE TRANSFUSIONS, NONE FOR DAY, HGB HAD BEEN HOLDING, IS LOWER TODAY AT 8.9, . MET B OK, SOME STABLE HYPERCHLOREMIA. ALERT, COOPERATIVE, NO PAIN. EDEMA NOTED  LEFT ARM, FROM NURSING HAS BEEN SEEN YESTERDAY, AND ACTUALLY BETTER TODAY. NOT TENDER. NO SIGN CELLULITIS. PIC IN PLACE    Plan DOPPLER U/S TO R/O CLOT. DISCUSSED WITH DR Burt Knack. DR Pat Patrick TO REVIEW LATER TONIGHT. ANTICOAGULATION CONTRAINDICATED DUE TO RECENT GI BLEED   Electronic Signatures: Dallas Schimke (MD)  (Signed 05-Sep-13 19:07)  Authored: Chief Complaint, VITAL SIGNS/ANCILLARY NOTES, Brief Assessment, Lab Results, Assessment/Plan  Last Updated: 05-Sep-13 19:07 by Dallas Schimke (MD)

## 2014-07-23 NOTE — Consult Note (Signed)
Brief Consult Note: Diagnosis: Elevated TNI in patient with pancreatic cancer s/p ERCP today with stenting for GOO.   Consult note dictated.   Comments: Will cycle cardiac enzymes, get echo to r/o wall motion abnormalities if patient allows. Patient refused exam today and pulled covers over his head/.  Electronic Signatures: Radene KneeKhan, Shaukat Ali (MD)   (Signed [phone]-Aug-13 09:19)  Co-Signer: Brief Consult Note Verta EllenManzi, Nikodem Leadbetter A (PA-C)   (Signed 21-Aug-13 17:05)  Authored: Brief Consult Note  Last Updated: 22-Aug-13 09:19 by Radene KneeKhan, Shaukat Ali (MD)

## 2014-07-23 NOTE — Op Note (Signed)
PATIENT NAME:  Corey Ortiz, [name] MR#:  [id number] DATE OF BIRTH:  30-Jan-1956  DATE OF PROCEDURE:  12/16/2011  PREOPERATIVE DIAGNOSIS:  Pancreatic cancer with poor venous access.   POSTOPERATIVE DIAGNOSIS: Pancreatic cancer with poor venous access.   PROCEDURES:  1. Ultrasound guidance for vascular access to right brachial vein.  2. Fluoroscopic guidance for placement of catheter.  3. Insertion of peripherally inserted central venous catheter, right arm.  SURGEON: Annice NeedyJason S. Yoshiaki Kreuser, MD   ANESTHESIA: Local.   ESTIMATED BLOOD LOSS: Minimal.   INDICATION FOR PROCEDURE: This is a 59 year old white male with advanced pancreatic cancer and requires IV therapy and a PICC line is requested.   DESCRIPTION OF PROCEDURE: The patient's right arm was sterilely prepped and draped, and a sterile surgical field was created. The right brachial vein was accessed under direct ultrasound guidance without difficulty with a micropuncture needle and permanent image was recorded. 0.018 wire was then placed into the superior vena cava. Peel-away sheath was placed over the wire. A single lumen peripherally inserted central venous catheter was then placed over the wire and the wire and peel-away sheath were removed. The catheter tip was placed into the superior vena cava and was secured at the skin at 34 cm with a sterile dressing. The catheter withdrew blood well and flushed easily with heparinized saline. The patient tolerated procedure well.  ____________________________ Annice NeedyJason S. Macie Baum, MD jsd:drc D: 12/17/2011 13:53:00 ET T: 12/17/2011 14:00:39 ET JOB#: [id number]  cc: Annice NeedyJason S. Eriberto Felch, MD, <Dictator> Annice NeedyJASON S Brooks Kinnan MD ELECTRONICALLY SIGNED 05/15/2011 9:00

## 2014-07-23 NOTE — Consult Note (Signed)
Found out from radiology that no pleural effusion present to warrant thoracentesis. Will proceed with ERCP later today to attemt biliary stent change. Discussed with cardiology earlier. Ok to proceed from cardiology point of view. Thanks.  Electronic Signatures: Lutricia Feilh, Candice Lunney (MD)  (Signed on 23-Aug-13 10:49)  Authored  Last Updated: 23-Aug-13 10:49 by Lutricia Feilh, Rodman Recupero (MD)

## 2014-07-23 NOTE — H&P (Signed)
PATIENT NAME:  Corey Ortiz, Corey Ortiz MR#:  [id number] DATE OF BIRTH:  01/13/1956  DATE OF ADMISSION:  10/21/2011  PRIMARY CARE PHYSICIAN: The patient does not have one.   CHIEF COMPLAINT: Altered mental status and weakness.   HISTORY OF PRESENT ILLNESS: This is a 60 year old male who was brought to the ER via EMS due to altered mental status. The patient himself is a very poor historian and currently is still very confused. Most of the history is obtained from the chart and from the nursing staff. As per the nursing staff, the patient apparently was outside in his home crawling around when one of his neighbors noticed and called EMS and he was brought here. Upon further questioning, it seems like the patient has a history of chronic liver disease and questionable suspected malignancy, but he does not followup physicians. He presented today to the hospital fairly confused. He was noted to be significantly jaundiced with a bilirubin of 28 and noted to be in acute renal failure with metabolic acidosis. Hospitalist services were contacted for further treatment and evaluation.   REVIEW OF SYSTEMS: Otherwise unobtainable given the patient's mental status.   PAST MEDICAL HISTORY:  1. Alcohol abuse. 2. Questionable history of malignancy.  3. Ongoing tobacco abuse.   ALLERGIES: No known drug allergies.   SOCIAL HISTORY: He still smokes about 1/2 pack to a pack per day and has been smoking for many years. Suspected history of alcohol abuse, but cannot tell me how much. He lives by himself.   FAMILY HISTORY: Unobtainable given the patient's mental status.   CURRENT MEDICATIONS: He is currently on no medications.    PHYSICAL EXAMINATION:  VITALS: Presently his temperature is 96.1, pulse 102, respirations 32, blood pressure 118/78, and saturation 97% on room air.   GENERAL: He is a very cachectic, jaundiced appearing African American male but in no apparent distress.   HEENT: Atraumatic, normocephalic.  His extraocular muscles are intact. His pupils are equal and reactive to light. Sclerae anicteric. No conjunctival injection. Positive scleral icterus. No oropharyngeal erythema. Oropharynx is dry.  NECK: Supple. No jugular venous distention. No bruits, no lymphadenopathy, and no thyromegaly.   HEART: Regular rate and rhythm. No murmurs, no rubs, and no clicks.   LUNGS: Clear to auscultation bilaterally. No rales, no rhonchi, and no wheezes.   ABDOMEN: Soft, flat, nontender, and nondistended. Has good bowel sounds. No hepatosplenomegaly appreciated.   EXTREMITIES: No evidence of any cyanosis. Positive clubbing. No pedal edema. +2 pedal and radial pulses bilaterally.   NEUROLOGIC: The patient is alert, awake, and oriented x1. Moves all extremities spontaneously. Otherwise difficult to do a full neurological exam.   SKIN: Moist and warm with no rashes.   LYMPHATIC: No cervical or axillary lymphadenopathy.   LABORATORY, DIAGNOSTIC AND RADIOLOGIC DATA: Serum glucose 114, BUN 65, creatinine 2.6, sodium 145, potassium 2.6, chloride 113, and bicarbonate 8. Anion gap 24. Ammonia elevated at 80. Alcohol level less than 0.003. Albumin 2.0, bilirubin 28.4, alkaline phosphatase 1769, AST 145, and ALT 151. Troponin 0.35. White cell count 12.1, hemoglobin 7.3, hematocrit 23.1, and platelet count 406. Prothrombin time 18.7. INR 1.5.   ABG showed a pH of 7.37, pCO2 less than 19, pO2 137, and saturation 97%.   CT scan of the head showed no evidence of acute intracranial abnormalities and involutional changes.   ASSESSMENT AND PLAN: This is a 59 year old male with history of suspected alcohol abuse and questionable malignancy who presents to the hospital with altered mental status  and confusion and noted to be severely jaundice with acute renal failure and abnormal liver function tests.  1. Altered mental status. Etiology currently unclear, but likely multifactorial in nature, possible underlying hepatic  encephalopathy combined with metabolic encephalopathy. I will go ahead and treat both. We will give lactulose twice a day to treat hepatic encephalopathy. We will give him IV fluids to treat his renal failure. His CT of the head is negative. We will follow his mental status closely after treatment with both fluids and lactulose.  2. Abnormal liver function tests and jaundice. Questionable if this is related to alcohol abuse versus hepatitis. He cannot give me a history. I will check a hepatitis panel, get an abdominal ultrasound, follow his LFTs, and get a gastroenterology consult. I discussed the case with Dr. Bluford Kaufmannh.  3. Acute renal failure. This is likely secondary to dehydration and prerenal in nature. I will go ahead and give him IV fluids, follow BUN and creatinine and urine output, renal dose medications, and avoid nephrotoxins. I will check a renal ultrasound.  4. Anemia/coagulopathy. I think this is probably related to his chronic liver disease. He has no evidence of any acute bleeding. I will follow this closely and avoid any heparin products.  5. Anion gap metabolic acidosis. I think this is multifactorial and likely related to renal failure, dehydration, and starvation ketosis. Continue aggressive IV fluids and we will continue to monitor. Follow serial ABGs. He does have an elevated lactic acid on his ABG. I assume this is lactic acidosis from his liver disease.  6. Elevated troponin. I think this is likely in the setting of acute renal failure and poor renal occurrence. I will cycle his cardiac markers and hold anticoagulants given his anemia and coagulopathy for now.            CODE STATUS: FULL CODE.   TIME SPENT ON ADMISSION: 55 minutes. ____________________________ Rolly PancakeVivek J. Cherlynn KaiserSainani, MD vjs:slb D: 10/21/2011 19:56:00 ET T: 10/22/2011 07:47:45 ET JOB#: [id number]  cc: Rolly PancakeVivek J. Cherlynn KaiserSainani, MD, <Dictator> Houston SirenVIVEK J SAINANI MD ELECTRONICALLY SIGNED 10/25/2011 13:30

## 2014-07-23 NOTE — Consult Note (Signed)
History of Present Illness:   Reason for Consult pancreatic mass rule out pancreatic cancer nausea vomiting now with possible gastric outlet obstruction    Date of Diagnosis 22-Oct-2011    HPI   A 59 year old male with history of pancreatic cancer and biliary obstruction status post stent, prior history of smoking, alcohol abuse, and malnutrition who presents with        The patient had an initial office visit was 7/31 , [facility].he presented with encephalopathy and jaundice, had abdo pain, which resolved.           He then underwent ERCP, STENT, PRBC transfusions.He recently underwent  complete staging and tx planning while watching lfts.  [facility] with complaints of nausea, vomiting, and abdominal pain. His GI symptoms of nausea and abdominal pain likely due to a combination of pancreatic cancer and possible gastric outlet obstruction due to pancreatic cancer. No repeat imaging was done today, but as per CAT scan of the abdomen, which was done on 11/16/2011, the patient had severe distention of the stomach. He was evaluated by surgery who felt that he may need bypass versus PEG placement. The patient reports that he will be cooperative with NG tube placement this time if required. He is also aware that he cannot have anything to eat or drink by mouth.  patient denies fever and has no bleeding from any site.  PFSH:   Family History negative    Social History negative alcohol, positive tobacco, 1/2 to 1 ppd    Additional Past Medical and Surgical History kidney stone 1979   Review of Systems:   HEENT no complaints    Lungs no complaints    Cardiac no complaints    GI early satiety    GU no complaints    Neuro no complaints   NURSING NOTES: ED Vital Sign Flow Sheet:   15-Aug-13 07:42    Temp Temperature: 96.2    Temp Source: oral    Pulse Pulse: 75    Respirations Respirations: 16    SBP SBP: 121    DBP DBP: 89    Pulse Ox %  Pulse Ox %: 99    Source: Room Air    Pain Scale (0-10) Pain Scale (0-10): Scale:10  NURSING NOTES: **Vital Signs.:   15-Aug-13 13:12    Vital Signs Type: Routine    Temperature Temperature (F): 98    Celsius: 36.6    Temperature Source: Oral    Pulse Pulse: 49    Respirations Respirations: 20    Systolic BP Systolic BP: 173    Diastolic BP (mmHg) Diastolic BP (mmHg): 95    Mean BP: 121    Pulse Ox % Pulse Ox %: 97    Pulse Ox Activity Level: At rest    Oxygen Delivery: Room Air/ 21 %   Physical Exam:   General well developed ,malnourished male patient in no acute distress other than nausea.    HEENT: normal    Lungs: clear    Cardiac: regular rate, rhythm    Breast: Bilateral normal    Abdomen: soft  tender  distended  diastant bowel sounds    Skin: intact    Extremities: No edema, rash or cyanosis    Neuro: AAOx3  cranial nerves intact  No focal deficits    Psych: normal appearance  alert and cooperative     kidney stone in 1974:    etoh and tob abuse:  Pancreatitis:    Denies medical history:    No Known Allergies:     mirtazapine 15 mg oral tablet: 1 tab(s) orally once a day (at bedtime), Active, 0, None   omeprazole 20 mg oral delayed release capsule: 1 cap(s) orally once a day (in the morning) at 6 AM, Active, 0, None  Laboratory Results:  Hepatic:  18-Jul-13 16:51    Bilirubin, Total  28.4   Alkaline Phosphatase  1769   SGPT (ALT)  151 (12-78 NOTE: NEW REFERENCE RANGE 02/26/2011)   SGOT (AST)  145   Total Protein, Serum  5.0   Albumin, Serum  2.0  19-Jul-13 01:16    Bilirubin, Total  26.2   Alkaline Phosphatase  1630   SGPT (ALT)  147 (12-78 NOTE: NEW REFERENCE RANGE 02/26/2011)   SGOT (AST)  133   Total Protein, Serum  4.3   Albumin, Serum  1.9   Bilirubin, Direct  20.70 (Result(s) reported on 22 Oct 2011 at 09:48AM.)  20-Jul-13 05:26    Bilirubin, Total  22.4   Alkaline Phosphatase  1395   SGPT (ALT)  119  (12-78 NOTE: NEW REFERENCE RANGE 02/26/2011)   SGOT (AST)  91   Total Protein, Serum  4.8   Albumin, Serum  1.9   Bilirubin, Direct  16.90 (Result(s) reported on 23 Oct 2011 at 07:38AM.)  21-Jul-13 04:18    Bilirubin, Total  11.0   Alkaline Phosphatase  1222   SGPT (ALT)  94 (12-78 NOTE: NEW REFERENCE RANGE 02/26/2011)   SGOT (AST)  54   Total Protein, Serum  4.5   Albumin, Serum  1.7  22-Jul-13 04:17    Bilirubin, Total  8.3   Alkaline Phosphatase  1000   SGPT (ALT) 77 (12-78 NOTE: NEW REFERENCE RANGE 02/26/2011)   SGOT (AST) 27   Total Protein, Serum  4.5   Albumin, Serum  1.6  23-Jul-13 04:09    Bilirubin, Total  7.8   Alkaline Phosphatase  935   SGPT (ALT) 74 (12-78 NOTE: NEW REFERENCE RANGE 02/26/2011)   SGOT (AST) 34   Total Protein, Serum  4.8   Albumin, Serum  1.7  25-Jul-13 06:37    Bilirubin, Total  6.4   Alkaline Phosphatase  750   SGPT (ALT) 61 (12-78 NOTE: NEW REFERENCE RANGE 02/26/2011)   SGOT (AST) 23   Total Protein, Serum  5.1   Albumin, Serum  1.8  27-Jul-13 05:50    Bilirubin, Total  5.5   Alkaline Phosphatase  584   SGPT (ALT) 49 (12-78 NOTE: NEW REFERENCE RANGE 02/26/2011)   SGOT (AST) 18   Total Protein, Serum  4.9   Albumin, Serum  1.7  28-Jul-13 04:20    Bilirubin, Total  5.1   Alkaline Phosphatase  580   SGPT (ALT) 46 (12-78 NOTE: NEW REFERENCE RANGE 02/26/2011)   SGOT (AST) 21   Total Protein, Serum  4.8   Albumin, Serum  1.7  29-Jul-13 04:13    Bilirubin, Total  5.5   Alkaline Phosphatase  561   SGPT (ALT) 45 (12-78 NOTE: NEW REFERENCE RANGE 02/26/2011)   SGOT (AST) 23   Total Protein, Serum  5.0   Albumin, Serum  1.8  31-Jul-13 10:17    Bilirubin, Total  6.1   Alkaline Phosphatase  595   SGPT (ALT) 52 (12-78 NOTE: NEW REFERENCE RANGE 02/26/2011)   SGOT (AST) 28   Total Protein, Serum  6.1   Albumin, Serum  2.2   Bilirubin, Direct  5.4 (  Result(s) reported on 03 Nov 2011 at 10:51AM.)  08-Aug-13 15:14     Bilirubin, Total  5.3   Alkaline Phosphatase  436   SGPT (ALT) 33   SGOT (AST) 24   Total Protein, Serum 6.6   Albumin, Serum  2.4   Bilirubin, Direct  5.0 (Result(s) reported on 11 Nov 2011 at 03:34PM.)  13-Aug-13 13:43    Bilirubin, Total  4.7   Alkaline Phosphatase  296   SGPT (ALT) 23   SGOT (AST) 23   Total Protein, Serum  6.1   Albumin, Serum  2.3  15-Aug-13 07:02    Bilirubin, Total  4.4   Alkaline Phosphatase  282   SGPT (ALT) 21   SGOT (AST) 29   Total Protein, Serum 6.6   Albumin, Serum  2.5  Urine Drugs:  18-Jul-13 22:39    Tricyclic Antidepressant, Ur Qual (comp) NEGATIVE (Result(s) reported on 21 Oct 2011 at 11:19PM.)   Amphetamines, Urine Qual. NEGATIVE   MDMA, Urine Qual. NEGATIVE   Cocaine Metabolite, Urine Qual. NEGATIVE   Opiate, Urine qual NEGATIVE   Phencyclidine, Urine Qual. NEGATIVE   Cannabinoid, Urine Qual. NEGATIVE   Barbiturates, Urine Qual. NEGATIVE   Benzodiazepine, Urine Qual. NEGATIVE (----------------- The URINE DRUG SCREEN provides only a preliminary, unconfirmed analytical test result and should not be used for non-medical  purposes.  Clinical consideration and professional judgment should be  applied to any positive drug screen result due to possible interfering substances.  A more specific alternate chemical method must be used in order to obtain a confirmed analytical result.  Gas chromatography/mass spectrometry (GC/MS) is the preferred confirmatory method.)   Methadone, Urine Qual. NEGATIVE  Cardiac:  18-Jul-13 16:51    Troponin I  0.35 (0.00-0.05 0.05 ng/mL or less: NEGATIVE  Repeat testing in 3-6 hrs  if clinically indicated. >0.05 ng/mL: POTENTIAL  MYOCARDIAL INJURY. Repeat  testing in 3-6 hrs if  clinically indicated. NOTE: An increase or decrease  of 30% or more on serial  testing suggests a  clinically important change)   CK, Total 116   CPK-MB, Serum 1.0 (Result(s) reported on 21 Oct 2011 at 05:47PM.)  19-Jul-13  01:16    Troponin I  0.34 (0.00-0.05 0.05 ng/mL or less: NEGATIVE  Repeat testing in 3-6 hrs  if clinically indicated. >0.05 ng/mL: POTENTIAL  MYOCARDIAL INJURY. Repeat  testing in 3-6 hrs if  clinically indicated. NOTE: An increase or decrease  of 30% or more on serial  testing suggests a  clinically important change)   CK, Total 158   CPK-MB, Serum 2.5 (Result(s) reported on 22 Oct 2011 at 02:07AM.)  Routine Hem:  15-Aug-13 07:02    WBC (CBC) 10.4   RBC (CBC) 4.62   Hemoglobin (CBC) 14.4   Hematocrit (CBC) 44.7   Platelet Count (CBC) 391 (Result(s) reported on 18 Nov 2011 at 09:35AM.)   MCV 97   MCH 31.1   MCHC 32.1   RDW  15.4   Radiology Results: CT:    13-Aug-13 18:10, CT Abdomen and Pelvis With Contrast   CT Abdomen and Pelvis With Contrast    REASON FOR EXAM:    (1) abd pain and vomiting; (2) abd pain and vomiting  COMMENTS:       PROCEDURE: CT  - CT ABDOMEN / PELVIS  W  - Nov 16 2011  6:10PM     RESULT: History: Abdominal pain    Comparison:  10/22/2011    Technique: Multiple axial imagesof the abdomen and  pelvis were performed   from the lung bases to the pubic symphysis, with p.o. contrast and with   85 ml of Isovue 370 intravenous contrast.    Findings:    The lung bases are clear. There is no pneumothorax. The heart size is   normal.     The liver demonstrates no focal abnormality. There is mild intrahepatic   biliary ductal dilatation. The gallbladder is unremarkable. There is   pneumobilia. There is a biliary stent present. The spleen demonstrates no   focal abnormality. The kidneys and adrenal glands are normal. The bladder   is unremarkable. There is severe pancreatic ductal dilatation. There is a   poorly defined pancreatic head mass again noted.    The stomach is severely distended. The small bowel is relatively   decompressed. There appears to be a transition point at the level of the   biliary stent. There is no pneumoperitoneum,  pneumatosis, or portal   venous gas. There is a small amount of abdominal and pelvic free fluid.   There is anasarca. There is no lymphadenopathy.   The abdominal aorta is normal in caliber .    The osseous structures are unremarkable.    IMPRESSION:     1. The stomach is severely distended. The small bowel is relatively   decompressed. There appears to be a transition point at the level of the   biliary stent    Dictation Site: 1          Verified By: Joellyn Haff, M.D., MD   Assessment and Plan:  Impression:   CANCER PANCREAS, S/P contrast CT, no evidence of advance or unresectable disease    1.Nausea/vomiting with possible gastric outlet obstruction:The patient reports that he will be cooperative with NG tube placement this time if required.        n.p.o., IV fluids, p.r.n. antiemetics, andantiemetics, reconsult surgery.  2. Hypokalemia possibly due to nausea and vomiting. potassium supplementation to IV fluids and intravenous route.  Hyperglycemia, possibly reactive. no history of diabetes.  Dehydration due to nausea and vomiting.  IV hydration. Elevated bilirubin and ALT likely due to biliary obstruction from pancreatic cancer. The patient has a stent in place.  Hypertension: antihypertensive medications as needed.  Heme. The patient had leukocytosis, anemia and thrombocytosis which could be due to hemoconcentration due to nausea and vomiting  8. Mild hypernatremia, has resolved currently.  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 If the patient has gastric outlet obstruction, he may need surgical intervention for pancreatic mass resection and possible gastric bypass or PEG tube placement.     Plan:   1.Pancreatic cancer with biliary stent:The patient had appointment with Dr. Christella Hartigan in [facility] for EUS today and is unable to keep the appointment due to current symptoms. APPT AT [facility] to see Dr.Mosca and have repeat imaging of pancreas.Discussed with Dr.Mosca ,would like the patient to be  transferred to [facility] for either curative resection versus palliative gastric bypass surgery depending upon repeat imagin to be done at [facility].Dr.Mosca will be accepting doctor at [facility]. with Dr.Panwar,will talk to Dr.Mosca prior to transfer.     CC Referral:   cc: DR DAN JACOBS   Electronic Signatures: Rosine Door (MD)  (Signed 15-Aug-13 16:55)  Authored: HISTORY OF PRESENT ILLNESS, PFSH, ROS, NURSING NOTES, PE, PAST MEDICAL HISTORY, ALLERGIES, HOME MEDICATIONS, LABS, OTHER RESULTS, ASSESSMENT AND PLAN, CC Referring Physician   Last Updated: 15-Aug-13 16:55 by Rosine Door (MD)

## 2014-07-23 NOTE — Discharge Summary (Signed)
PATIENT NAME:  Corey Ortiz, Corey Ortiz MR#:  [id number] DATE OF BIRTH:  [birth year]  DATE OF ADMISSION:  11/18/2011 DATE OF DISCHARGE:  11/18/2011  DISCHARGE DIAGNOSES:  1. Pancreatic cancer.  2. Possible gastric outlet obstruction. 3. History of alcohol and tobacco abuse.  4. Biliary obstruction status post stent.  5. Anemia. 6. Malnutrition.  7. Leukocytosis. 8. Gastroesophageal reflux disease. 9. Dehydration.  10. Hypokalemia.  11. Hypertension.   DISPOSITION: [facility] service of Dr. Madaline BrilliantMosca.  CONSULTATIONS:  1. Surgical consultation with Dr. Egbert GaribaldiBird. 2. Palliative care consultation by Dr. Harvie JuniorPhifer.  3. Oncology consultation with Dr. Consuela MimesNaik.  4. Psychiatric consultation with Dr. Toni Amendlapacs.    HOSPITAL COURSE: The patient is a 59 year old male who was recently diagnosed with pancreatic cancer. He also had a biliary stent placed for obstructive jaundice. The patient presented to the Emergency Room complaining of nausea, vomiting, and abdominal pain. He was initially admitted to the hospital and had a CT scan which showed possible gastric outlet obstruction. The patient was noncompliant with treatment plan and initially signed himself out AGAINST MEDICAL ADVICE. He was also having difficulty commuting between his different appointments [facility]. The patient returned to the hospital complaining of nausea, vomiting, and abdominal pain. There was a high clinical suspicion for gastric obstruction. Oncology consultation was obtained and the patient's oncologist contacted the patient's surgeon, [facility]Mosca[facility] in transfer since the patient is likely going to require open bypass and may be additional surgery for his pancreatic cancer. The [facility] in a stable condition   TIME SPENT: 45 minutes.    ____________________________ Darrick MeigsSangeeta Jud Fanguy, MD sp:ap D: 11/19/2011 11:38:05 ET T: 11/19/2011 14:21:00 ET JOB#: [id number]  cc: Darrick MeigsSangeeta Morrissa Shein, MD, <Dictator> Hyman HopesPaul J. Madaline BrilliantMosca, MD Darrick MeigsSANGEETA Pink Maye MD ELECTRONICALLY SIGNED 11/19/2011 15:02

## 2014-07-23 NOTE — Discharge Summary (Signed)
PATIENT NAME:  Corey Ortiz, Corey Ortiz MR#:  [id number] DATE OF BIRTH:  04-05-1956  DATE OF ADMISSION:  11/24/2011 DATE OF DISCHARGE:  12/17/2011  PRINCIPLE DIAGNOSIS: Unresectable locally aggressive pancreatic cancer causing obstructive jaundice and gastric outlet obstruction.   OTHER DIAGNOSES: 1. Anemia. 2. Dehydration. 3. Severe malnutrition. 4. History of alcohol and tobacco abuse. 5. Severe intractable depression.   PRINCIPLE PROCEDURES PERFORMED DURING THIS ADMISSION: 11/29/2011 Palliative hepaticojejunostomy Roux-en-Y, Loop gastrojejunostomy, open cholecystectomy, gastrostomy tube placement with transgastric jejunostomy feeding tube.   HOSPITAL COURSE: Corey Ortiz was admitted to the hospital and underwent the above-mentioned procedure for the above-mentioned diagnosis. 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 His nasogastric tube was removed. His gastrostomy port was placed to drain. His tube feedings were begun through his jejunostomy port and he was started on gastrostomy tube feedings when his jejunostomy port became clogged. Despite initiation of antidepressants, the patient remained very depressed and essentially noninteractive and ultimately palliative care consult was obtained on 09/10 where he initially said that he wanted to fight, but ultimately essentially gave up and was ultimately discharged home with hospice care on 12/17/2011. He was given our number in case he had any surgical issues, but no specific surgical follow-up was planned.   ____________________________ Claude MangesWilliam F. Akeyla Molden, MD wfm:ap D: 01/05/2012 19:00:18 ET             T: 01/06/2012 08:21:13 ET                JOB#: [id number] cc: Claude MangesWilliam F. Oriel Rumbold, MD, <Dictator> Claude MangesWILLIAM F Koni Kannan  MD ELECTRONICALLY SIGNED 01/07/2012 7:43

## 2014-07-23 NOTE — H&P (Signed)
PATIENT NAME:  Corey Ortiz, Corey Ortiz MR#:  [id number] DATE OF BIRTH:  1955-10-05  DATE OF ADMISSION:  11/24/2011  PRIMARY CARE PHYSICIAN: Nonlocal.  REFERRING PHYSICIAN:   Glennie Isle, MD   CHIEF COMPLAINT: Nausea and vomiting, low blood pressure.   HISTORY OF PRESENT ILLNESS: The patient is a 59 year old African American male with a history of pancreatic cancer diagnosed recently, history of alcohol abuse, tobacco use, who presented to the ED with nausea with coffee-ground vomiting. In addition, the patient had a low blood pressure, according to his daughter. The patient also has confusion. He cannot provide any information. According to the daughter, the patient went to [facility] for ERCP today, was noted to have a low blood pressure, so the procedure was discontinued and then the patient was sent home; but the patient became confused, with nausea and coffee-ground vomiting, so they brought the patient to the Emergency Department  for further evaluation. The patient was noted to have an elevated troponin, was treated with aspirin. In addition, the patient's potassium is 2.9. He got potassium 40 mEq IVPB in the Emergency Department. According to previous documents, the patient was just discharged one week ago for severe abdominal pain, nausea, vomiting due to pancreatic cancer, possible gastric outlet obstruction.   PAST MEDICAL HISTORY:  1. Recent diagnosis of pancreatic cancer. 2. Biliary obstruction status post stent.  3. History of alcohol and tobacco abuse in the past.  4. Anemia.  5. Renal failure.  6. Malnutrition.  7. Leukocytosis in the past.   MEDICATIONS:  1. Remeron 50 mg p.o. at bedtime.  2. Omeprazole 20 mg daily.   PAST SURGICAL HISTORY: Biliary obstruction, status post stent placement.  ALLERGIES: No known drug allergies.   FAMILY HISTORY: Unable to obtain, but according to previous documents no history of coronary artery disease or malignancy.   REVIEW OF  SYSTEMS: Unable to obtain at this time.   PHYSICAL EXAMINATION:  VITALS: Temperature 97.6, blood pressure 168/83, pulse 17, oxygen saturation 18, respirations 18.   GENERAL: The patient is alert but confused and uncooperative.   HEENT: The patient is uncooperative, unable to examine at this time.   NECK: Supple. No JVD or carotid bruits. No lymphadenopathy. No thyromegaly.   CARDIOVASCULAR: S1, S2 regular rate and rhythm. No murmurs, gallops.   PULMONARY: Bilateral air entry. No wheezing or rales. No use of accessory muscles to breathe.   ABDOMEN: Soft diffuse tenderness. No rigidity, no rebound. It is difficult to estimate whether the patient has organomegaly.   EXTREMITIES: No edema, clubbing, or cyanosis. Strong bilateral pedal pulses.   SKIN: No rash or jaundice.   NEUROLOGY: The patient is confused and uncooperative. Unable to examine at this time.   LABORATORY, DIAGNOSTIC, AND RADIOLOGICAL DATA: Acetaminophen less than 2.0. Glucose 96, BUN 30, creatinine 1.29, sodium 145, potassium 2.9, chloride 103, bicarbonate 35, calcium 8.6, bilirubin 3.5, alkaline phosphatase 170, albumin 2.2, total protein 5.8, ethanol level less than 3.0. WBC 9.6, hemoglobin 11.4, platelets 255, lipase 28, INR 1.1. Troponin 0.29.  Magnesium 1.8. Ammonia level less than 25. CAT scan of head: No acute disease, no acute intracranial abnormality. Chest x-ray: No acute disease of the chest. Urinalysis is pending.    IMPRESSION:  1. Altered mental status, possibly due to dehydration.  2. Gastrointestinal bleeding with coffee-ground vomiting.  3. Dehydration.  4. Hypokalemia.  5. Elevated troponin.  6. Pancreatic cancer.   PLAN OF TREATMENT:  1. The patient will be admitted to Medical floor with  telemonitor. Follow up troponin. I will not give aspirin due to gastrointestinal bleeding, but I will get a Cardiology consult for elevated troponin.  2. For gastrointestinal bleeding, and the pancreatic cancer, and  possible gastric of obstruction, we will keep n.p.o., give IV fluid, and we will get a GI consult, follow-up CBC.  3. For dehydration, we will give D5 normal saline with potassium and follow-up BMP.   I discussed the patient's situation and the plan of treatment with the patient's daughter, who is MidwifeHealthcare Power of Attorney, and discussed DNR/DNI code status with her and informing her that the patient has a poor prognosis, but the patient's daughter wants everything to be done to save his life.   TIME SPENT: About 65 minutes   ____________________________ Shaune PollackQing Raydin Bielinski, MD qc:cbb D: 11/24/2011 12:44:13 ET T: 11/24/2011 13:14:04 ET JOB#: [id number]  cc: Shaune PollackQing Kaisey Huseby, MD, <Dictator> Shaune PollackQING Lanetta Figuero MD ELECTRONICALLY SIGNED 11/30/2011 17:00

## 2014-07-23 NOTE — Consult Note (Signed)
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 Bx taken. Could not tell where ampulla was. Got sphincterotome in. Can't tell whether this is PD or CBD, which is grossly dilated.Did place 7 fr x 5cm in. Repeat CT to see stent in CBD vs PD. THanks.  Electronic Signatures: Lutricia Feilh, Alexander Mcauley (MD)  (Signed on 19-Jul-13 14:11)  Authored  Last Updated: 19-Jul-13 14:11 by Lutricia Feilh, Lucynda Rosano (MD)

## 2014-07-23 NOTE — H&P (Signed)
PATIENT NAME:  Corey, Ortiz MR#:  [id number] DATE OF BIRTH:  01/31/1956  DATE OF ADMISSION:  11/16/2011  PRIMARY CARE PHYSICIAN: Nonlocal REFERRING PHYSICIAN: Dr. Shaune Pollack   CHIEF COMPLAINT: Abdominal pain, nausea, vomiting for three days.   HISTORY OF PRESENT ILLNESS: 59 year old African American male with history of pancreatic cancer diagnosed last month, history of alcohol abuse, tobacco use presented to the ED with severe abdominal pain with nausea, vomiting for the past three days. Actually patient was diagnosed with pancreatic cancer last month and was placed with a stent due to jaundice, biliary dilatation. Patient was discharged home last a month and since discharge patient still has abdominal pain but for the past three days abdominal pain is worsening and severe, constant. In addition, patient has nausea, vomiting. He could not eat, drink. The abdominal pain is in epigastric area but without radiation to the back. Patient also lost weight so he came to ED for further evaluation.   PAST MEDICAL HISTORY: 1. Pancreatic cancer. 2. Alcohol abuse. 3. Tobacco use.   SOCIAL HISTORY: Patient has been smoking 1 pack a day for many years, but he says he could not smoke due to abdominal pain since discharge last month. He denies any alcohol or illicit drugs.    PAST SURGICAL HISTORY: Pancreatic cancer status post stent.   FAMILY HISTORY: Patient does not know.  ALLERGIES: No.  HOME MEDICATIONS:  1. Mirtazapine 15 mg p.o. at bedtime.  2. Omeprazole 20 mg p.o. daily.  REVIEW OF SYSTEMS: Patient has very severe pain. He could not answer review of systems properly but has limited review of systems. CONSTITUTIONAL: No fever, chills. No headache or dizziness but has weakness and weight loss. EYES: No double vision, blurred vision. ENT: No epistaxis, postnasal drip, dysphagia. RESPIRATORY: No cough, sputum, shortness of breath, or hematemesis. CARDIOVASCULAR: No chest pain, palpitations, or leg  edema. GASTROINTESTINAL: Positive for severe abdominal pain with nausea, vomiting but no melena, black stool, bloody stool. No diarrhea. GENITOURINARY: No dysuria, hematuria. SKIN: Positive for jaundice but no bruises, no rash. HEMATOLOGY: No easy bruising or bleeding. NEUROLOGY: No syncope, loss of consciousness.   PHYSICAL EXAMINATION:  VITALS: Temperature 97.6, blood pressure 126/87, pulse 55, oxygen saturation 98% on room air.   GENERAL: Patient is alert, awake but in severe abdominal pain and distress. Patient has malnutrition. He is very thin.    HEENT: Pupils round, equal, reactive to light, accommodation. There is icterus. Dry oral mucosa. Clear oropharynx.   NECK: Supple. No JVD or carotid bruit. No lymphadenopathy. No thyromegaly.   CARDIOVASCULAR: S1, S2 regular rate, rhythm. No murmurs, gallops.   PULMONARY: Bilateral air entry. No wheezing, rales. No use of accessory muscles to breathe.   ABDOMEN: Bowel sounds present but has tenderness in epigastric area. No rebound. Difficult to estimate whether patient has organomegaly.   EXTREMITIES: No edema, clubbing, or cyanosis. No calf tenderness. Strong bilateral pedal pulses.   NEUROLOGIC: Alert and oriented x3. No focal deficit. Power 5/5. Sensation intact. Deep tendon reflexes mute.   SKIN: No rash but has jaundice. No bruises.   LABORATORY, DIAGNOSTIC AND RADIOLOGICAL DATA: WBC 14.3, hemoglobin 11.0, platelets 519, glucose 149, BUN 36, creatinine 1.25, sodium 146, potassium 2.9, chloride 100, bicarbonate 33, lipase 23, albumin 2.3, total protein 6.1. Abdominal x-ray showed probably pneumobilia pancreatic duct stent present. No definite bowel obstruction. There is fairly significant air-fluid level in the stomach. CAT scan of abdomen and pelvis shows stomach is severely distended. Small bowel is relatively depressed.  EKG shows sinus bradycardia with marked sinus arrhythmia at 53 beats per minute.   IMPRESSION:  1. Pancreatic cancer  with severe abdominal pain, nausea, vomiting.  2. Leukocytosis, possibly due to reaction.  3. Dehydration due to abdominal pain, nausea, vomiting.  4. Hypokalemia due to abdominal pain, nausea, vomiting.  5. 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  6. Anemia.  7. History of alcohol abuse and tobacco use.   PLAN OF TREATMENT:  1. Patient will be admitted to medical floor. Will keep n.p.o. Get NGT and give pain management and will give Zofran for nausea, vomiting. Will get a GI consult from Dr. Bluford Kaufmannh and follow up CBC.  2. Will start IV fluid with normal saline 100 mL/h and follow up BMP.  3. For hypokalemia, will give potassium supplement and check potassium level and magnesium level.  4. Will ask palliative care consult.  5. Discussed the patient's CODE STATUS with patient. Patient wants everything to be done.   6. GI and deep vein thrombosis prophylaxis.   Discussed the patient's situation and plan of treatment with patient.   TIME SPENT: About 65 minutes.   ____________________________ Shaune PollackQing Eila Runyan, MD qc:cms D: 11/16/2011 20:29:12 ET T: 11/17/2011 06:46:56 ET JOB#: [id number]  cc: Shaune PollackQing Dalyla Chui, MD, <Dictator> Shaune PollackQING Corderius Saraceni MD ELECTRONICALLY SIGNED 11/18/2011 14:21

## 2014-07-23 NOTE — H&P (Signed)
PATIENT NAME:  Corey Ortiz, Corey Ortiz MR#:  [id number] DATE OF BIRTH:  [birth year]  DATE OF ADMISSION:  11/18/2011  ER REFERRING PHYSICIAN: Si Raider, MD   PRIMARY CARE PHYSICIAN: None.  ONCOLOGIST: Benita Gutter, MD  GASTROENTEROLOGIST: Lutricia Feil, MD  CHIEF COMPLAINT: Nausea, vomiting, and abdominal pain.   HISTORY OF PRESENT ILLNESS: The patient is a 59 year old African American male who was recently diagnosed with pancreatic cancer and had a prolonged hospitalization in July. He was discharged home in a stable condition on 11/01/2011 for outpatient work-up and treatment of his pancreatic cancer. The patient returned to the hospital on 11/16/2011 complaining of nausea, vomiting, and abdominal pain. At that time, a CAT scan of the abdomen showed severe gastric dilatation. There was a concern for gastric outlet obstruction due to pancreatic cancer. Therefore a NG tube was placed and a surgical consultation was obtained by Dr. Excell Seltzer who felt that the patient may need a PEG or open bypass because of gastric outlet obstruction. During that hospitalization, the patient was very unreasonable and unwilling to cooperate with his medical care. He was very upset that he was not being given anything to eat and drink in spite of being explained about gastric outlet obstruction. He repeatedly wanted to sign out AMA and was unwilling to cooperate with his care, although he was reassured that his concerns will be addressed. This was done by Patient Relations, the primary physician, and also palliative care. In spite of repeated counseling, the patient signed himself out against medical advice and went home where he tried to eat and drink something and developed severe abdominal pain and had nausea and vomiting. Therefore, he returned to the hospital. This time the patient expresses his wishes to stay in the hospital and be cooperative with his care.   ALLERGIES: No known drug allergies.      PAST MEDICAL  HISTORY:  1. Recent diagnosis of pancreatic cancer. 2. Recent biliary obstruction status post stent. 3. History of alcohol and tobacco abuse in the past. 4. Anemia. 5. History of acute renal failure. 6. Malnutrition.  7. Leukocytosis in the past.   MEDICATIONS:  1. Remeron 50 mg at bedtime.  2. Omeprazole 20 mg daily.  PAST SURGICAL HISTORY: Biliary obstruction status post stent placement.   FAMILY HISTORY: The patient denies any family history of coronary artery disease or malignancy.   SOCIAL HISTORY: The patient used to smoke 1 pack per day but has not been able to smoke recently due to abdominal pain. Denies any alcohol or drug abuse. He lives alone. He has two daughters who live in [place].   REVIEW OF SYSTEMS: CONSTITUTIONAL: Reports fatigue and weakness. EYES: Denies any blurred or double vision. ENT: Denies any tinnitus or ear pain. RESPIRATORY: Denies any cough or wheezing. CARDIOVASCULAR: Denies any chest pain or palpitations. GI: Reports nausea, vomiting, and abdominal pain. Denies any diarrhea. GU: Denies any dysuria or hematuria. ENDOCRINE: Denies any polyuria or nocturia. HEME/LYMPH: Denies any anemia or easy bruisability. INTEGUMENT: Denies any acne or rash. MUSCULOSKELETAL: Denies any swelling or gout. NEUROLOGICAL: Denies any numbness or weakness. PSYCH: Denies any anxiety or depression.   PHYSICAL EXAMINATION:   VITAL SIGNS: Temperature 96.2, heart rate 75, respiratory rate 16, blood pressure 121/89, and pulse oximetry 99% on room air.   GENERAL: The patient is a thin-built, African American male lying in bed. He is in some distress from pain.   HEAD: Atraumatic, normocephalic.   EYES: There is pallor and icterus. No cyanosis. Pupils are  equally round and reactive to light and accommodation. Extraocular movements are intact.   ENT: Wet mucous membranes. No oropharyngeal erythema or thrush.   NECK: Supple. No masses. No JVD. No thyromegaly or lymphadenopathy.    CHEST WALL: No tenderness to palpation. Not using accessory muscles of respiration. No intercostal muscle retraction.   LUNGS: Bilaterally clear to auscultation. No wheezing, rales, or rhonchi.   CARDIOVASCULAR: S1 and S2 regular. No murmurs, rubs, or gallops.   ABDOMEN: Soft and tender, especially in the epigastric region. No guarding or rigidity. No distention. Hypoactive bowel sounds.   SKIN: No rashes or lesions.   PERIPHERIES: No pedal edema. 2+ pedal pulses.   MUSCULOSKELETAL: No cyanosis or clubbing.   NEUROLOGIC: Awake, alert, and oriented x3. Nonfocal neurological exam. Cranial nerves are grossly intact.   PSYCH: Normal mood and affect.   RESULTS: No repeat imaging was done.   White count 10.4, hemoglobin 11.4, hematocrit 44.7, and platelets 391. Magnesium 2.5. Glucose 122, BUN 31, creatinine 1.01, sodium 146, potassium 27, chloride 102, CO2 34, calcium 8.9, bilirubin 4.4, alkaline phosphatase 282, ALT 21, and AST 29. Lipase 28.   ASSESSMENT AND PLAN: A 58 year old male with history of pancreatic cancer and biliary obstruction status post stent, prior history of smoking, alcohol abuse, and malnutrition who presents with nausea, vomiting, and abdominal pain.  1. Recurrent nausea, vomiting, and abdominal pain likely due to a combination of pancreatic cancer and possible gastric outlet obstruction due to pancreatic cancer. No repeat imaging was done today, but as per CAT scan of the abdomen, which was done on 11/16/2011, the patient had severe distention of the stomach. He was evaluated by surgery who felt that he may need bypass versus PEG placement. The patient reports that he will be cooperative with NG tube placement this time if required. He is also aware that he cannot have anything to eat or drink by mouth. We will keep him n.p.o., admit him to the hospital and start on IV fluids, p.r.n. antiemetics, and antiemetics, and reconsult surgery.  2. Hypokalemia possibly due to  nausea and vomiting. We will add potassium to his IV fluids and also supplement it aggressively via the intravenous route.  3. Hyperglycemia, possibly reactive. The patient has no history of diabetes.  4. Dehydration due to nausea and vomiting. We will hydrate the patient with IV fluids.  5. Elevated bilirubin and ALT likely due to biliary obstruction from pancreatic cancer. The patient has a stent in place and his levels are stable.  6. Blood pressure. During his last admission his blood pressure was elevated, which was felt to be due to stress and pain. Currently his pressure is stable. We will watch it closely and start on antihypertensive medications as needed.  7. Heme. The patient had leukocytosis, anemia and thrombocytosis which appears to have resolved now. He could be hemoconcentrated due to nausea and vomiting and this may be not his true hemoglobin and hematocrit.  8. Mild hypernatremia, has resolved currently.  9. 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 If the patient has gastric outlet obstruction, he may need bypass or PEG tube placement.  10. Pancreatic cancer with biliary stent We will consult oncology for recommendations regarding further management and also reconsult Gastroenterology since the patient was supposed to follow up with Dr. Christella Hartigan in [place] for EUS today and is unable to keep the appointment.  11. The patient appears to be very depressed. We will obtain a palliative care consultation to discuss goals of treatment. I reviewed all  medical records and discussed with the patient the plan of care and management.   TIME SPENT: 75 minutes.  ____________________________ Darrick MeigsSangeeta Camilla Skeen, MD sp:slb D: 11/18/2011 10:59:43 ET T: 11/18/2011 12:01:37 ET JOB#: [id number]  cc: Darrick MeigsSangeeta Elzy Tomasello, MD, <Dictator> Knute Neuobert G. Lorre NickGittin, MD Darrick MeigsSANGEETA Jaiana Sheffer MD ELECTRONICALLY SIGNED 11/18/2011 15:08
